# Patient Record
Sex: Female | Born: 1955
Health system: Southern US, Community
[De-identification: ages and names within clinical notes are randomized; demographics above are authoritative.]

## PROBLEM LIST (undated history)

## (undated) DIAGNOSIS — B019 Varicella without complication: Secondary | ICD-10-CM

## (undated) DIAGNOSIS — G43909 Migraine, unspecified, not intractable, without status migrainosus: Secondary | ICD-10-CM

## (undated) DIAGNOSIS — C801 Malignant (primary) neoplasm, unspecified: Secondary | ICD-10-CM

## (undated) DIAGNOSIS — K219 Gastro-esophageal reflux disease without esophagitis: Secondary | ICD-10-CM

## (undated) DIAGNOSIS — R002 Palpitations: Secondary | ICD-10-CM

## (undated) DIAGNOSIS — Z889 Allergy status to unspecified drugs, medicaments and biological substances status: Secondary | ICD-10-CM

## (undated) DIAGNOSIS — Z973 Presence of spectacles and contact lenses: Secondary | ICD-10-CM

## (undated) HISTORY — DX: Malignant (primary) neoplasm, unspecified: C80.1

## (undated) HISTORY — PX: WISDOM TOOTH EXTRACTION: SHX21

## (undated) HISTORY — PX: TUBAL LIGATION: SHX77

## (undated) HISTORY — DX: Varicella without complication: B01.9

## (undated) HISTORY — DX: Migraine, unspecified, not intractable, without status migrainosus: G43.909

## (undated) HISTORY — DX: Allergy status to unspecified drugs, medicaments and biological substances: Z88.9

## (undated) HISTORY — PX: BASAL CELL CARCINOMA EXCISION: SHX1214

## (undated) HISTORY — DX: Gastro-esophageal reflux disease without esophagitis: K21.9

---

## 1992-08-27 DIAGNOSIS — T8859XA Other complications of anesthesia, initial encounter: Secondary | ICD-10-CM

## 1992-08-27 HISTORY — DX: Other complications of anesthesia, initial encounter: T88.59XA

## 1992-08-27 HISTORY — PX: TUBAL LIGATION: SHX77

## 2009-11-25 LAB — HM COLONOSCOPY

## 2013-06-15 DIAGNOSIS — D171 Benign lipomatous neoplasm of skin and subcutaneous tissue of trunk: Secondary | ICD-10-CM | POA: Insufficient documentation

## 2013-06-15 DIAGNOSIS — N950 Postmenopausal bleeding: Secondary | ICD-10-CM | POA: Insufficient documentation

## 2015-10-20 MED FILL — AMOXICILLIN 875 MG TABLET: 875 | 10 days supply | Qty: 20 | Fill #0

## 2015-12-20 MED FILL — MECLIZINE 25 MG TABLET: 25 | 10 days supply | Qty: 30 | Fill #0

## 2016-02-01 ENCOUNTER — Ambulatory Visit (INDEPENDENT_AMBULATORY_CARE_PROVIDER_SITE_OTHER): Payer: Commercial Managed Care - PPO | Admitting: Obstetrics & Gynecology

## 2016-02-01 ENCOUNTER — Encounter: Payer: Self-pay | Admitting: Obstetrics & Gynecology

## 2016-02-01 VITALS — BP 110/72 | HR 53 | Resp 16 | Ht 66.0 in | Wt 145.0 lb

## 2016-02-01 DIAGNOSIS — E559 Vitamin D deficiency, unspecified: Secondary | ICD-10-CM | POA: Diagnosis not present

## 2016-02-01 DIAGNOSIS — Z124 Encounter for screening for malignant neoplasm of cervix: Secondary | ICD-10-CM

## 2016-02-01 DIAGNOSIS — Z01419 Encounter for gynecological examination (general) (routine) without abnormal findings: Secondary | ICD-10-CM

## 2016-02-01 DIAGNOSIS — Z1151 Encounter for screening for human papillomavirus (HPV): Secondary | ICD-10-CM

## 2016-02-01 MED ORDER — PROGESTERONE MICRONIZED 200 MG PO CAPS: 200.0000 mg | ORAL_CAPSULE | Freq: Every day | ORAL | Status: DC

## 2016-02-01 MED ORDER — ESTRADIOL 0.05 MG/24HR TD PTTW: 1.0000 | MEDICATED_PATCH | TRANSDERMAL | Status: DC

## 2016-02-01 NOTE — Progress Notes (Signed)
Subjective:    Krista Schwartz is a 60 y.o. MW P3 (all daughters) female who presents for an annual exam. The patient has no complaints today. She wants a refill of her HRT.  The patient is not currently sexually active. GYN screening history: last pap: was normal. The patient wears seatbelts: yes. The patient participates in regular exercise: yes. Has the patient ever been transfused or tattooed?: no. The patient reports that there is not domestic violence in her life.   Menstrual History: OB History    Gravida Para Term Preterm AB TAB SAB Ectopic Multiple Living   3 3 3       3       Menarche age: 53  No LMP recorded. Patient is postmenopausal. LMP about 5 years ago.    The following portions of the patient's history were reviewed and updated as appropriate: allergies, current medications, past family history, past medical history, past social history, past surgical history and problem list.  Review of Systems Pertinent items noted in HPI and remainder of comprehensive ROS otherwise negative. Married for 36 years, abstinent for several years due to husband's ED. 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 Mammogram UTD. Colonscopy normal at 60 yo. Her fasting labs are normal and UTD.   Objective:    BP 110/72 mmHg  Pulse 53  Resp 16  Ht 5\' 6"  (1.676 m)  Wt 145 lb (65.772 kg)  BMI 23.41 kg/m2  General Appearance:    Alert, cooperative, no distress, appears stated age  Head:    Normocephalic, without obvious abnormality, atraumatic  Eyes:    PERRL, conjunctiva/corneas clear, EOM's intact, fundi    benign, both eyes  Ears:    Normal TM's and external ear canals, both ears  Nose:   Nares normal, septum midline, mucosa normal, no drainage    or sinus tenderness  Throat:   Lips, mucosa, and tongue normal; teeth and gums normal  Neck:   Supple, symmetrical, trachea midline, no adenopathy;    thyroid:  no enlargement/tenderness/nodules; no carotid   bruit or JVD  Back:      Symmetric, no curvature, ROM normal, no CVA tenderness  Lungs:     Clear to auscultation bilaterally, respirations unlabored  Chest Wall:    No tenderness or deformity   Heart:    Regular rate and rhythm, S1 and S2 normal, no murmur, rub   or gallop  Breast Exam:    No tenderness, masses, or nipple abnormality  Abdomen:     Soft, non-tender, bowel sounds active all four quadrants,    no masses, no organomegaly  Genitalia:    Normal female without lesion, discharge or tenderness, NSSA, NT, mobile, no adnexal masses     Extremities:   Extremities normal, atraumatic, no cyanosis or edema  Pulses:   2+ and symmetric all extremities  Skin:   Skin color, texture, turgor normal, no rashes or lesions  Lymph nodes:   Cervical, supraclavicular, and axillary nodes normal  Neurologic:   CNII-XII intact, normal strength, sensation and reflexes    throughout  .    Assessment:    Healthy female exam.    Plan:     Thin prep Pap smear. with cotesting Recheck Vit D (h/o low level)

## 2016-02-02 LAB — CYTOLOGY - PAP

## 2016-02-02 LAB — VITAMIN D 25 HYDROXY (VIT D DEFICIENCY, FRACTURES): Vit D, 25-Hydroxy: 77 ng/mL (ref 30–100)

## 2016-02-03 NOTE — Addendum Note (Signed)
Addended by: Asencion Islam on: 02/03/2016 09:09 AM   Modules accepted: Medications

## 2016-06-13 ENCOUNTER — Telehealth: Payer: Self-pay | Admitting: Behavioral Health

## 2016-06-13 ENCOUNTER — Encounter: Payer: Self-pay | Admitting: Behavioral Health

## 2016-06-13 NOTE — Telephone Encounter (Signed)
Pre-Visit Call completed with patient and chart updated.   Pre-Visit Info documented in Specialty Comments under [employer].    

## 2016-06-14 ENCOUNTER — Encounter: Payer: Self-pay | Admitting: Family Medicine

## 2016-06-14 ENCOUNTER — Ambulatory Visit (INDEPENDENT_AMBULATORY_CARE_PROVIDER_SITE_OTHER): Payer: Commercial Managed Care - PPO | Admitting: Family Medicine

## 2016-06-14 VITALS — BP 119/72 | HR 55 | Temp 98.2°F | Ht 64.5 in | Wt 143.6 lb

## 2016-06-14 DIAGNOSIS — Z0000 Encounter for general adult medical examination without abnormal findings: Secondary | ICD-10-CM | POA: Diagnosis not present

## 2016-06-14 DIAGNOSIS — Z85828 Personal history of other malignant neoplasm of skin: Secondary | ICD-10-CM | POA: Diagnosis not present

## 2016-06-14 DIAGNOSIS — N63 Unspecified lump in unspecified breast: Secondary | ICD-10-CM | POA: Diagnosis not present

## 2016-06-14 NOTE — Patient Instructions (Signed)
It was very nice to meet you today- I can certainly see you for a physical at your convenience.  We will arrange a diagnostic mammogram for you- this is a mammogram with ultrasound as necessary. Let me know if you do not hear about this appt in the next week or so.  Take care!

## 2016-06-14 NOTE — Progress Notes (Signed)
[facility] [address] [place], [place] [address] [phone]  Date:  06/14/2016   Name:  Krista Schwartz   DOB:  [birth year]   MRN:  [id number]  PCP:  Emily Filbert, MD    Chief Complaint: Establish Care (Pt here to est care. )   History of Present Illness:  Krista Schwartz is a 60 y.o. very pleasant female patient who presents with the following:  Here today as a new patient to establish care.  She does have an OBG- was with Dr. Carylon Perches with [facility].   -she changed to Dr. Hulan Fray as Dr. Tessa Lerner left the practice.  She is located in [place]  Flu shot is UTD She has 3 children ages; 58, 96 and 37.  She has 5 grand and 1 more on the way.  All 3 of her children are nearby.    She sees June Leap for dermatology.  History of non- melanoma sking cancer  She is on HRT per Dr. Hulan Fray.    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   She had bad allergies last year- she hopes that this will not happen again Never a smoker.  She does enjoy walking.  She does not have a lot of free time- she enjoys reading and crafting.   She has noted a possible bump on her right breast for the last few days. She has had a lipoma in the past and wonders if this might be the same thing  Last labs done last year- everything looked fine per her report.   Colonoscopy is UTD There are no active problems to display for this patient.   Past Medical History:  Diagnosis Date  . Cancer (HCC)    basal and squamous cell  . H/O seasonal allergies     Past Surgical History:  Procedure Laterality Date  . TUBAL LIGATION    . WISDOM TOOTH EXTRACTION      Social History  Substance Use Topics  . Smoking status: Never Smoker  . Smokeless tobacco: Never Used  . Alcohol use No    Family History  Problem Relation Age of Onset  . Parkinson's disease Mother   . Heart  attack Father   . Cancer Paternal Grandfather     skin  . Cancer Maternal Grandmother     skin    No Known Allergies  Medication list has been reviewed and updated.  Current Outpatient Prescriptions on File Prior to Visit  Medication Sig Dispense Refill  . Magnesium 400 MG CAPS Take by mouth daily.    . meclizine (ANTIVERT) 25 MG tablet   1  . Menaquinone-7 (VITAMIN K2 PO) Take 500 mcg by mouth every other day.     . progesterone (PROMETRIUM) 200 MG capsule Take 1 capsule (200 mg total) by mouth daily. 60 capsule 7   No current facility-administered medications on file prior to visit.     Review of Systems:  As per HPI- otherwise negative.  No fever, chills, nausea, vomiting, CP, SOB   Physical Examination: Blood pressure 119/72, pulse (!) 55, temperature 98.2 F (36.8 C), temperature source Oral, height 5' 4.5" (1.638 m), weight 143 lb 9.6 oz (65.1 kg), SpO2 100 %.  Vitals:   06/14/16 1048  Weight: 143 lb 9.6 oz (65.1 kg)  Height: 5' 4.5" (1.638 m)   Body mass index  is 24.27 kg/m. Ideal Body Weight: Weight in (lb) to have BMI = 25: 147.6  GEN: WDWN, NAD, Non-toxic, A & O x 3, normal weight, looks well HEENT: Atraumatic, Normocephalic. Neck supple. No masses, No LAD.  Bilateral TM wnl, oropharynx normal.  PEERL,EOMI.   Ears and Nose: No external deformity. CV: RRR, No M/G/R. No JVD. No thrill. No extra heart sounds. PULM: CTA B, no wheezes, crackles, rhonchi. No retractions. No resp. distress. No accessory muscle use. EXTR: No c/c/e NEURO Normal gait.  PSYCH: Normally interactive. Conversant. Not depressed or anxious appearing.  Calm demeanor.  She indicates a firm area in the right upper outer breast- this does not feel worrisome but will certainly look at it further   Assessment and Plan: Breast mass - Plan: MM DIAG BREAST TOMO UNI RIGHT, MM SCREENING BREAST TOMO BILATERAL  Encounter for medical examination to establish care  History of skin cancer  Here to  establish care as a new patient.  She is quite healthy except for history of skin cancer She has a breast concern and is due for screening so will set up a diagnostic mammo for her Continue derm follow-up as usual   Signed Lamar Blinks, MD

## 2016-06-14 NOTE — Progress Notes (Signed)
Pre visit review using our clinic review tool, if applicable. No additional management support is needed unless otherwise documented below in the visit note. 

## 2016-06-15 ENCOUNTER — Encounter: Payer: Self-pay | Admitting: Emergency Medicine

## 2016-06-21 ENCOUNTER — Telehealth: Payer: Self-pay | Admitting: Family Medicine

## 2016-06-21 ENCOUNTER — Other Ambulatory Visit: Payer: Self-pay | Admitting: Family Medicine

## 2016-06-21 DIAGNOSIS — N63 Unspecified lump in unspecified breast: Secondary | ICD-10-CM

## 2016-06-21 NOTE — Telephone Encounter (Signed)
Caller name: Amber with [facility] Can be reached: [phone]  Reason for call: please change mammogram order. Needs to be for diagnostic bilateral mammo [phone]. Please add [place] right breast limited [phone].

## 2016-06-22 NOTE — Telephone Encounter (Signed)
[employer], orders have been updated by Dr. Lorelei Pont. No further needs at this time.

## 2016-07-02 ENCOUNTER — Ambulatory Visit
Admission: RE | Admit: 2016-07-02 | Discharge: 2016-07-02 | Disposition: A | Discharge status: Home / Self Care | Payer: Commercial Managed Care - PPO | Source: Ambulatory Visit | Attending: Family Medicine | Admitting: Family Medicine

## 2016-07-02 ENCOUNTER — Ambulatory Visit (INDEPENDENT_AMBULATORY_CARE_PROVIDER_SITE_OTHER): Payer: Commercial Managed Care - PPO | Admitting: Family Medicine

## 2016-07-02 ENCOUNTER — Encounter: Payer: Self-pay | Admitting: Family Medicine

## 2016-07-02 VITALS — BP 110/62 | HR 58 | Temp 98.5°F | Resp 16 | Ht 64.5 in | Wt 145.2 lb

## 2016-07-02 DIAGNOSIS — N63 Unspecified lump in unspecified breast: Secondary | ICD-10-CM

## 2016-07-02 DIAGNOSIS — R531 Weakness: Secondary | ICD-10-CM

## 2016-07-02 DIAGNOSIS — J0140 Acute pansinusitis, unspecified: Secondary | ICD-10-CM

## 2016-07-02 LAB — POC INFLUENZA A&B (BINAX/QUICKVUE)
Influenza A, POC: NEGATIVE
Influenza B, POC: NEGATIVE

## 2016-07-02 MED ORDER — FLUTICASONE PROPIONATE 50 MCG/ACT NA SUSP: 2.0000 | Freq: Every day | NASAL | 6 refills | Status: DC

## 2016-07-02 MED ORDER — FLUCONAZOLE 150 MG PO TABS: 150.0000 mg | ORAL_TABLET | Freq: Once | ORAL | 0 refills | Status: AC

## 2016-07-02 MED ORDER — FLUCONAZOLE 150 MG PO TABS: 150.0000 mg | ORAL_TABLET | Freq: Once | ORAL | 0 refills | Status: DC

## 2016-07-02 MED ORDER — AMOXICILLIN-POT CLAVULANATE 875-125 MG PO TABS: 1.0000 | ORAL_TABLET | Freq: Two times a day (BID) | ORAL | 0 refills | Status: DC

## 2016-07-02 MED FILL — FLUCONAZOLE 150 MG TABLET: 150 | 2 days supply | Qty: 2 | Fill #0

## 2016-07-02 MED FILL — SM ALLERGY RELIEF 50 MCG SP: 50 MCG | 30 days supply | Qty: 16 | Fill #0

## 2016-07-02 MED FILL — AMOX-CLAV 875-125 MG TABLET: 875-125 | 10 days supply | Qty: 20 | Fill #0

## 2016-07-02 NOTE — Patient Instructions (Signed)

## 2016-07-02 NOTE — Progress Notes (Signed)
Pre visit review using our clinic review tool, if applicable. No additional management support is needed unless otherwise documented below in the visit note. 

## 2016-07-02 NOTE — Progress Notes (Signed)
Patient ID: Krista Schwartz, female    DOB: [birth year]  Age: 60 y.o. MRN: [id number]    Subjective:  Subjective  HPI Krista Schwartz presents for sinus congestion and cough.  It started with sore throat and that is now gone.   She is taking otc cough med, allergra/ zyrtec with little relief.  No fever.     Review of Systems  Constitutional: Negative for chills and fever.  HENT: Positive for congestion, postnasal drip, rhinorrhea and sinus pressure.   Respiratory: Positive for cough and wheezing. Negative for chest tightness and shortness of breath.   Cardiovascular: Negative for chest pain, palpitations and leg swelling.  Allergic/Immunologic: Negative for environmental allergies.    History Past Medical History:  Diagnosis Date  . Cancer (HCC)    basal and squamous cell  . Chicken pox   . GERD (gastroesophageal reflux disease)   . H/O seasonal allergies   . Migraines     She has a past surgical history that includes Tubal ligation and Wisdom tooth extraction.   Her family history includes Cancer in her maternal grandmother and paternal grandfather; Heart attack in her father; Hyperlipidemia in her other; Hypertension in her other; Parkinson's disease in her mother.She reports that she has never smoked. She has never used smokeless tobacco. She reports that she does not drink alcohol or use drugs.  Current Outpatient Prescriptions on File Prior to Visit  Medication Sig Dispense Refill  . Cholecalciferol (VITAMIN D3) 5000 units TABS Take 1 tablet by mouth every other day.    . estradiol (VIVELLE-DOT) 0.05 MG/24HR patch Place 1 patch onto the skin 2 (two) times a week.    . fexofenadine (ALLEGRA) 180 MG tablet Take 180 mg by mouth daily as needed.     . Magnesium 400 MG CAPS Take by mouth daily.    . meclizine (ANTIVERT) 25 MG tablet   1  . Menaquinone-7 (VITAMIN K2 PO) Take 500 mcg by mouth every other day.     . progesterone (PROMETRIUM) 200 MG capsule Take 1 capsule  (200 mg total) by mouth daily. 60 capsule 7   No current facility-administered medications on file prior to visit.      Objective:  Objective  Physical Exam  Constitutional: She is oriented to person, place, and time. She appears well-developed and well-nourished.  HENT:  Right Ear: External ear normal.  Left Ear: External ear normal.  Nose: Right sinus exhibits maxillary sinus tenderness and frontal sinus tenderness. Left sinus exhibits maxillary sinus tenderness and frontal sinus tenderness.  + PND + errythema  Eyes: Conjunctivae are normal. Right eye exhibits no discharge. Left eye exhibits no discharge.  Cardiovascular: Normal rate, regular rhythm and normal heart sounds.   No murmur heard. Pulmonary/Chest: Effort normal and breath sounds normal. No respiratory distress. She has no wheezes. She has no rales. She exhibits no tenderness.  Musculoskeletal: She exhibits no edema.  Lymphadenopathy:    She has cervical adenopathy.       Right cervical: Superficial cervical adenopathy present.       Left cervical: Superficial cervical adenopathy present.  Neurological: She is alert and oriented to person, place, and time.  Nursing note and vitals reviewed.  BP 110/62 (BP Location: Left Arm, Patient Position: Sitting, Cuff Size: Normal)   Pulse (!) 58   Temp 98.5 F (36.9 C) (Oral)   Resp 16   Ht 5' 4.5" (1.638 m)   Wt 145 lb 3.2 oz (65.9 kg)   SpO2 98%  BMI 24.54 kg/m  Wt Readings from Last 3 Encounters:  07/02/16 145 lb 3.2 oz (65.9 kg)  06/14/16 143 lb 9.6 oz (65.1 kg)  02/01/16 145 lb (65.8 kg)     No results found for: WBC, HGB, HCT, PLT, GLUCOSE, CHOL, TRIG, HDL, LDLDIRECT, LDLCALC, ALT, AST, NA, K, CL, CREATININE, BUN, CO2, TSH, PSA, INR, GLUF, HGBA1C, MICROALBUR  Patient was never admitted.   Assessment & Plan:  Plan  I am having Krista Schwartz start on amoxicillin-clavulanate. I am also having her maintain her meclizine, progesterone, Magnesium, Menaquinone-7  (VITAMIN K2 PO), Vitamin D3, estradiol, fexofenadine, fluconazole, and fluticasone.  Meds ordered this encounter  Medications  . amoxicillin-clavulanate (AUGMENTIN) 875-125 MG tablet    Sig: Take 1 tablet by mouth 2 (two) times daily.    Dispense:  20 tablet    Refill:  0  . DISCONTD: fluticasone (FLONASE) 50 MCG/ACT nasal spray    Sig: Place 2 sprays into both nostrils daily.    Dispense:  16 g    Refill:  6  . DISCONTD: fluconazole (DIFLUCAN) 150 MG tablet    Sig: Take 1 tablet (150 mg total) by mouth once. May repeat in 3 days prn    Dispense:  2 tablet    Refill:  0  . fluconazole (DIFLUCAN) 150 MG tablet    Sig: Take 1 tablet (150 mg total) by mouth once. May repeat in 3 days prn    Dispense:  2 tablet    Refill:  0  . fluticasone (FLONASE) 50 MCG/ACT nasal spray    Sig: Place 2 sprays into both nostrils daily.    Dispense:  16 g    Refill:  6    Problem List Items Addressed This Visit    None    Visit Diagnoses    Weakness generalized    -  Primary   Relevant Orders   POC Influenza A&B (Binax test) (Completed)   Acute pansinusitis, recurrence not specified       Relevant Medications   amoxicillin-clavulanate (AUGMENTIN) 875-125 MG tablet   fluconazole (DIFLUCAN) 150 MG tablet   fluticasone (FLONASE) 50 MCG/ACT nasal spray      Follow-up: Return if symptoms worsen or fail to improve.  Ann Held, DO

## 2016-10-04 ENCOUNTER — Other Ambulatory Visit: Payer: Self-pay | Admitting: Obstetrics & Gynecology

## 2017-01-14 DIAGNOSIS — Z85828 Personal history of other malignant neoplasm of skin: Secondary | ICD-10-CM | POA: Diagnosis not present

## 2017-01-14 DIAGNOSIS — L570 Actinic keratosis: Secondary | ICD-10-CM | POA: Diagnosis not present

## 2017-01-14 DIAGNOSIS — Z08 Encounter for follow-up examination after completed treatment for malignant neoplasm: Secondary | ICD-10-CM | POA: Diagnosis not present

## 2017-01-23 ENCOUNTER — Ambulatory Visit (INDEPENDENT_AMBULATORY_CARE_PROVIDER_SITE_OTHER): Payer: Commercial Managed Care - PPO | Admitting: Family Medicine

## 2017-01-23 VITALS — BP 126/74 | HR 68 | Temp 98.3°F | Ht 65.0 in | Wt 146.6 lb

## 2017-01-23 DIAGNOSIS — J209 Acute bronchitis, unspecified: Secondary | ICD-10-CM

## 2017-01-23 DIAGNOSIS — J029 Acute pharyngitis, unspecified: Secondary | ICD-10-CM

## 2017-01-23 LAB — POCT RAPID STREP A (OFFICE): Rapid Strep A Screen: NEGATIVE

## 2017-01-23 MED ORDER — ALBUTEROL SULFATE HFA 108 (90 BASE) MCG/ACT IN AERS: 2.0000 | INHALATION_SPRAY | Freq: Four times a day (QID) | RESPIRATORY_TRACT | 0 refills | Status: DC | PRN

## 2017-01-23 MED ORDER — AZITHROMYCIN 250 MG PO TABS: ORAL_TABLET | ORAL | 0 refills | Status: DC

## 2017-01-23 MED ORDER — PREDNISONE 20 MG PO TABS: ORAL_TABLET | ORAL | 0 refills | Status: DC

## 2017-01-23 MED FILL — AZITHROMYCIN 250 MG TABLET: 250 | 5 days supply | Qty: 6 | Fill #0

## 2017-01-23 MED FILL — predniSONE 20 MG TABS: 20 | 6 days supply | Qty: 9 | Fill #0

## 2017-01-23 NOTE — Progress Notes (Signed)
[facility] at [facility] [address], [place], [place] [address] [phone]  Date:  01/23/2017   Name:  Krista Schwartz   DOB:  [birth year]   MRN:  [id number]  PCP:  Darreld Mclean, MD    Chief Complaint: Sore Throat (c/o sore throat, loss of voice, heaviness in chest, dry cough x 2 weeks. )   History of Present Illness:  Krista Schwartz is a 61 y.o. very pleasant female patient who presents with the following:  Generally healthy woman except for history of skin cancer- here today with illness for about 2 weeks  She first noted a cough, then she began to lose her voice.  Her throat is sore off and on, more so this weekend- today is wednesday No fever noted, no aches or chills No GI symptoms Some sneezing, she is taking her allergy meds She did notice some wheezing today- she does not generally have wheezing and does not have an inhaler The cough is dry She is post- menopausal No sick contacts at home Her GYN is Dr. Hulan Schwartz- she will see her for follow-up soon  She has tried some aleve, some OTC allergy meds.  No antipyretics today   Patient Active Problem List   Diagnosis Date Noted  . History of skin cancer 06/14/2016    Past Medical History:  Diagnosis Date  . Cancer (HCC)    basal and squamous cell  . Chicken pox   . GERD (gastroesophageal reflux disease)   . H/O seasonal allergies   . Migraines     Past Surgical History:  Procedure Laterality Date  . TUBAL LIGATION    . WISDOM TOOTH EXTRACTION      Social History  Substance Use Topics  . Smoking status: Never Smoker  . Smokeless tobacco: Never Used  . Alcohol use No    Family History  Problem Relation Age of Onset  . Parkinson's disease Mother   . Heart attack Father   . Cancer Paternal Grandfather        skin  . Cancer Maternal Grandmother        skin  . Hypertension Other   . Hyperlipidemia Other     No Known  Allergies  Medication list has been reviewed and updated.  Current Outpatient Prescriptions on File Prior to Visit  Medication Sig Dispense Refill  . Cholecalciferol (VITAMIN D3) 5000 units TABS Take 1 tablet by mouth every other day.    . estradiol (VIVELLE-DOT) 0.05 MG/24HR patch PLACE 1 PATCH ONTO THE SKIN TWICE A WEEK 8 patch 6  . fexofenadine (ALLEGRA) 180 MG tablet Take 180 mg by mouth daily as needed.     . meclizine (ANTIVERT) 25 MG tablet   1  . Menaquinone-7 (VITAMIN K2 PO) Take 500 mcg by mouth every other day.     . progesterone (PROMETRIUM) 200 MG capsule Take 1 capsule (200 mg total) by mouth daily. 60 capsule 7   No current facility-administered medications on file prior to visit.     Review of Systems:  As per HPI- otherwise negative. Eating normally Planning to travel to [place] for business later on this week   Physical Examination: Vitals:   01/23/17 0832  BP: 126/74  Pulse: 68  Temp: 98.3 F (36.8 C)   Vitals:   01/23/17 0832  Weight: 146 lb 9.6 oz (66.5 kg)  Height: 5\' 5"  (1.651 m)   Body mass index is 24.4  kg/m. Ideal Body Weight: Weight in (lb) to have BMI = 25: 149.9  GEN: WDWN, NAD, Non-toxic, A & O x 3, appears healthy and younger than age, but does not feel well today Hoarse voice  HEENT: Atraumatic, Normocephalic. Neck supple. No masses, No LAD.  Bilateral TM wnl, oropharynx normal.  PEERL,EOMI.   Ears and Nose: No external deformity. CV: RRR, No M/G/R. No JVD. No thrill. No extra heart sounds. PULM: CTA B, no wheezes, crackles, rhonchi. No retractions. No resp. distress. No accessory muscle use. EXTR: No c/c/e NEURO Normal gait.  PSYCH: Normally interactive. Conversant. Not depressed or anxious appearing.  Calm demeanor.   Results for orders placed or performed in visit on 01/23/17  POCT rapid strep A  Result Value Ref Range   Rapid Strep A Screen Negative Negative    Assessment and Plan: Sore throat - Plan: POCT rapid strep  A  Acute bronchitis, unspecified organism - Plan: predniSONE (DELTASONE) 20 MG tablet, albuterol (PROVENTIL HFA;VENTOLIN HFA) 108 (90 Base) MCG/ACT inhaler, azithromycin (ZITHROMAX) 250 MG tablet  Here today with acute illness Her main sx are congestion, wheezing and hoarse voice, cough Will treat with prednisone, albuterol and azithromycin No NSAIDs while on prednisone She will let me know if not feeling better soon  Signed Lamar Blinks, MD

## 2017-01-23 NOTE — Patient Instructions (Signed)
I am sorry that you are not feeling well!   Use the albuterol inhaler as needed for cough, wheezing and chest tightness Also ok to use OTC cough medications as needed Use the prednisone for 6 days, and the azithromycin (antibioic) for 5 days as directed Avoid NSAIDs like ibuprofen or aleve while on the prednisone   Please let me know if you are not feeling better in the next few days- Sooner if worse.

## 2017-02-06 ENCOUNTER — Ambulatory Visit (INDEPENDENT_AMBULATORY_CARE_PROVIDER_SITE_OTHER): Payer: Commercial Managed Care - PPO

## 2017-02-06 ENCOUNTER — Encounter: Payer: Self-pay | Admitting: Obstetrics & Gynecology

## 2017-02-06 ENCOUNTER — Ambulatory Visit (INDEPENDENT_AMBULATORY_CARE_PROVIDER_SITE_OTHER): Payer: Commercial Managed Care - PPO | Admitting: Obstetrics & Gynecology

## 2017-02-06 VITALS — BP 93/59 | HR 57 | Resp 16 | Ht 66.0 in | Wt 144.0 lb

## 2017-02-06 DIAGNOSIS — Z1151 Encounter for screening for human papillomavirus (HPV): Secondary | ICD-10-CM

## 2017-02-06 DIAGNOSIS — N950 Postmenopausal bleeding: Secondary | ICD-10-CM

## 2017-02-06 DIAGNOSIS — Z01419 Encounter for gynecological examination (general) (routine) without abnormal findings: Secondary | ICD-10-CM | POA: Diagnosis not present

## 2017-02-06 DIAGNOSIS — Z124 Encounter for screening for malignant neoplasm of cervix: Secondary | ICD-10-CM | POA: Diagnosis not present

## 2017-02-06 LAB — CBC
HCT: 41.8 % (ref 35.0–45.0)
Hemoglobin: 13.7 g/dL (ref 11.7–15.5)
MCH: 28.8 pg (ref 27.0–33.0)
MCHC: 32.8 g/dL (ref 32.0–36.0)
MCV: 88 fL (ref 80.0–100.0)
MPV: 10.7 fL (ref 7.5–12.5)
Platelets: 267 10*3/uL (ref 140–400)
RBC: 4.75 MIL/uL (ref 3.80–5.10)
RDW: 13.1 % (ref 11.0–15.0)
WBC: 6.3 10*3/uL (ref 3.8–10.8)

## 2017-02-06 LAB — LIPID PANEL
Cholesterol: 197 mg/dL (ref <200)
HDL: 45 mg/dL — ABNORMAL LOW (ref >50)
LDL Cholesterol: 130 mg/dL — ABNORMAL HIGH (ref <100)
Total CHOL/HDL Ratio: 4.4 Ratio (ref <5.0)
Triglycerides: 112 mg/dL (ref <150)
VLDL: 22 mg/dL (ref <30)

## 2017-02-06 LAB — COMPREHENSIVE METABOLIC PANEL
ALT: 9 U/L (ref 6–29)
AST: 14 U/L (ref 10–35)
Albumin: 4.1 g/dL (ref 3.6–5.1)
Alkaline Phosphatase: 70 U/L (ref 33–130)
BUN: 11 mg/dL (ref 7–25)
CO2: 26 mmol/L (ref 20–31)
Calcium: 9 mg/dL (ref 8.6–10.4)
Chloride: 107 mmol/L (ref 98–110)
Creat: 1.01 mg/dL (ref 0.50–1.05)
Glucose, Bld: 85 mg/dL (ref 65–99)
Potassium: 4 mmol/L (ref 3.5–5.3)
Sodium: 140 mmol/L (ref 135–146)
Total Bilirubin: 0.7 mg/dL (ref 0.2–1.2)
Total Protein: 6.3 g/dL (ref 6.1–8.1)

## 2017-02-06 NOTE — Addendum Note (Signed)
Addended by: Asencion Islam on: 02/06/2017 08:54 AM   Modules accepted: Orders

## 2017-02-06 NOTE — Progress Notes (Signed)
Subjective:    Krista Schwartz is a 61 y.o. MW P3 (all daughters- Anderson Malta is our nurse at the [employer] office) female who presents for an annual exam. The patient has no complaints today except she has had some spotting this year, the beginning of April. On prometrium and estrogen patch.  The patient is not currently sexually active. GYN screening history: last pap: was normal. The patient wears seatbelts: yes. The patient participates in regular exercise: yes. Has the patient ever been transfused or tattooed?: no. The patient reports that there is not domestic violence in her life.   Menstrual History: OB History    Gravida Para Term Preterm AB Living   3 3 3     3    SAB TAB Ectopic Multiple Live Births                  Menarche age: 44 No LMP recorded. Patient is postmenopausal.    The following portions of the patient's history were reviewed and updated as appropriate: allergies, current medications, past family history, past medical history, past social history, past surgical history and problem list.  Review of Systems Pertinent items are noted in HPI.   Married for 63 years Works at [employer] [place]- no breast/gyn/colon cancer Colonoscopy due at 61 yo   Objective:    BP (!) 93/59   Pulse (!) 57   Resp 16   Ht 5\' 6"  (1.676 m)   Wt 144 lb (65.3 kg)   BMI 23.24 kg/m   General Appearance:    Alert, cooperative, no distress, appears stated age  Head:    Normocephalic, without obvious abnormality, atraumatic  Eyes:    PERRL, conjunctiva/corneas clear, EOM's intact, fundi    benign, both eyes  Ears:    Normal TM's and external ear canals, both ears  Nose:   Nares normal, septum midline, mucosa normal, no drainage    or sinus tenderness  Throat:   Lips, mucosa, and tongue normal; teeth and gums normal  Neck:   Supple, symmetrical, trachea midline, no adenopathy;    thyroid:  no enlargement/tenderness/nodules; no carotid   bruit or JVD  Back:      Symmetric, no curvature, ROM normal, no CVA tenderness  Lungs:     Clear to auscultation bilaterally, respirations unlabored  Chest Wall:    No tenderness or deformity   Heart:    Regular rate and rhythm, S1 and S2 normal, no murmur, rub   or gallop  Breast Exam:    No tenderness, masses, or nipple abnormality  Abdomen:     Soft, non-tender, bowel sounds active all four quadrants,    no masses, no organomegaly  Genitalia:    Normal female without lesion, discharge or tenderness, NSSA, NT, mobile, normal adnexal exam     Extremities:   Extremities normal, atraumatic, no cyanosis or edema  Pulses:   2+ and symmetric all extremities  Skin:   Skin color, texture, turgor normal, no rashes or lesions  Lymph nodes:   Cervical, supraclavicular, and axillary nodes normal  Neurologic:   CNII-XII intact, normal strength, sensation and reflexes    throughout  .    Assessment:    Healthy female exam.   PMB on combination HRT   Plan:     Thin prep Pap smear. with cotesting Gyn u/s

## 2017-02-07 ENCOUNTER — Telehealth: Payer: Self-pay | Admitting: *Deleted

## 2017-02-07 DIAGNOSIS — R9389 Abnormal findings on diagnostic imaging of other specified body structures: Secondary | ICD-10-CM

## 2017-02-07 LAB — VITAMIN D 25 HYDROXY (VIT D DEFICIENCY, FRACTURES): Vit D, 25-Hydroxy: 57 ng/mL (ref 30–100)

## 2017-02-07 MED ORDER — MISOPROSTOL 200 MCG PO TABS: ORAL_TABLET | ORAL | 0 refills | Status: DC

## 2017-02-07 NOTE — Telephone Encounter (Signed)
Pt notied of U/S results and pt is scheduled for Endometrial Biopsy and per Dr Rea College for Cytotec 600 mg sent to [facility]

## 2017-02-12 LAB — CYTOLOGY - PAP
Diagnosis: NEGATIVE
HPV: NOT DETECTED

## 2017-02-19 ENCOUNTER — Encounter: Payer: Self-pay | Admitting: Obstetrics & Gynecology

## 2017-02-19 ENCOUNTER — Ambulatory Visit (INDEPENDENT_AMBULATORY_CARE_PROVIDER_SITE_OTHER): Payer: Commercial Managed Care - PPO | Admitting: Obstetrics & Gynecology

## 2017-02-19 VITALS — BP 128/78 | HR 51 | Resp 16 | Ht 66.0 in | Wt 144.0 lb

## 2017-02-19 DIAGNOSIS — N950 Postmenopausal bleeding: Secondary | ICD-10-CM | POA: Diagnosis not present

## 2017-02-19 NOTE — Progress Notes (Signed)
   Subjective:    Patient ID: Krista Schwartz, female    DOB: [birth year], 61 y.o.   MRN: [id number]  HPI 61 yo MW P3 here for [facility] due to PMB on HRT. Endometrial thickness on u/s was 6 mm. She took cytotec last night.   Review of Systems     Objective:   Physical Exam [id number] Breathing, conversing, and ambulating normally  UPT negative, consent signed, time out done Cervix prepped with betadine and grasped with a single tooth tenaculum Uterus sounded to 8 cm Pipelle used for 2 passes with a small amount of tissue obtained. She tolerated the procedure well.      Assessment & Plan:  PMB- probably due to HRT Await pathology results

## 2017-02-19 NOTE — Addendum Note (Signed)
Addended by: Asencion Islam on: 02/19/2017 04:05 PM   Modules accepted: Orders

## 2017-02-20 ENCOUNTER — Other Ambulatory Visit: Payer: Self-pay | Admitting: Obstetrics & Gynecology

## 2017-02-26 ENCOUNTER — Other Ambulatory Visit: Payer: Self-pay | Admitting: *Deleted

## 2017-02-26 DIAGNOSIS — Z780 Asymptomatic menopausal state: Secondary | ICD-10-CM

## 2017-02-26 MED ORDER — PROGESTERONE MICRONIZED 200 MG PO CAPS: 200.0000 mg | ORAL_CAPSULE | Freq: Every day | ORAL | 7 refills | Status: DC

## 2017-02-26 NOTE — Telephone Encounter (Signed)
RF request for Progesterone 200 mg sent to [facility] [address] [facility] per Dr Hulan Fray

## 2017-04-18 ENCOUNTER — Other Ambulatory Visit: Payer: Self-pay | Admitting: Obstetrics & Gynecology

## 2017-04-22 ENCOUNTER — Other Ambulatory Visit: Payer: Self-pay | Admitting: *Deleted

## 2017-04-22 MED ORDER — ESTRADIOL 0.05 MG/24HR TD PTTW: MEDICATED_PATCH | TRANSDERMAL | 10 refills | Status: DC

## 2017-06-19 ENCOUNTER — Other Ambulatory Visit: Payer: Self-pay | Admitting: Emergency Medicine

## 2017-06-19 MED ORDER — MECLIZINE HCL 25 MG PO TABS
25.0000 mg | ORAL_TABLET | ORAL | 1 refills | Status: DC | PRN
Start: 2017-06-19 — End: 2020-09-08

## 2017-07-10 DIAGNOSIS — L570 Actinic keratosis: Secondary | ICD-10-CM | POA: Diagnosis not present

## 2017-07-17 ENCOUNTER — Other Ambulatory Visit (HOSPITAL_COMMUNITY): Payer: Self-pay | Admitting: Obstetrics & Gynecology

## 2017-07-17 DIAGNOSIS — Z1231 Encounter for screening mammogram for malignant neoplasm of breast: Secondary | ICD-10-CM

## 2017-08-14 ENCOUNTER — Ambulatory Visit
Admission: RE | Admit: 2017-08-14 | Discharge: 2017-08-14 | Disposition: A | Discharge status: Home / Self Care | Payer: Commercial Managed Care - PPO | Source: Ambulatory Visit | Attending: Obstetrics & Gynecology | Admitting: Obstetrics & Gynecology

## 2017-08-14 DIAGNOSIS — Z1231 Encounter for screening mammogram for malignant neoplasm of breast: Secondary | ICD-10-CM | POA: Diagnosis not present

## 2017-09-21 NOTE — Progress Notes (Addendum)
[facility] [address], [place], [place] [address] [phone]  Date:  09/23/2017   Name:  Krista Schwartz   DOB:  Oct 14, 1955   MRN:  [id number]  PCP:  Darreld Mclean, MD    Chief Complaint: Annual Exam (Pt here for CPE and labs. )   History of Present Illness:  Krista Schwartz is a 62 y.o. very pleasant female patient who presents with the following:  Here today for a CPE History of skin cancer, GERD, allergies  Last visit here in May with illness She sees Dr. Hulan Fray for GYN care and had endo bx for post- menopausal bleeding last year.   bx results were negative  Labs: she is fasting  Flu: done  Tetanus: she had the tdap in 2009, now due for an update  Colon: 2011 Mammo: 2018 Hep C:   She continues to see dermatology on a regular basis  She is getting "minimal" exercise due to work She feels like her sleep is good Mood is fall No falls   Her brother recently had an MI and was dx with bicuspid aortic valve.  She was told that this is genetic and she should be tested as well   Pt reports she had a normal EKG in 2017 at [facility] but is ok with my repeating this today No SOB She may occasionally notice a minute or so of chest discomfort which she attributes to GERD- this is not anything new to her She never has any CP with exertion  Patient Active Problem List   Diagnosis Date Noted  . History of skin cancer 06/14/2016    Past Medical History:  Diagnosis Date  . Cancer (HCC)    basal and squamous cell  . Chicken pox   . GERD (gastroesophageal reflux disease)   . H/O seasonal allergies   . Migraines     Past Surgical History:  Procedure Laterality Date  . TUBAL LIGATION    . WISDOM TOOTH EXTRACTION      Social History   Tobacco Use  . Smoking status: Never Smoker  . Smokeless tobacco: Never Used  Substance Use Topics  . Alcohol use: No    Alcohol/week: 0.0 oz  . Drug  use: No    Family History  Problem Relation Age of Onset  . Parkinson's disease Mother   . Heart attack Father   . Cancer Paternal Grandfather        skin  . Cancer Maternal Grandmother        skin  . Hypertension Other   . Hyperlipidemia Other     No Known Allergies  Medication list has been reviewed and updated.  Current Outpatient Medications on File Prior to Visit  Medication Sig Dispense Refill  . Cholecalciferol (VITAMIN D3) 5000 units TABS Take 1 tablet by mouth every other day.    . estradiol (VIVELLE-DOT) 0.05 MG/24HR patch Place 1 patch on skin twice weekly 8 patch 10  . fexofenadine (ALLEGRA) 180 MG tablet Take 180 mg by mouth daily as needed.     . meclizine (ANTIVERT) 25 MG tablet Take 1 tablet (25 mg total) by mouth as needed for dizziness. 30 tablet 1  . progesterone (PROMETRIUM) 200 MG capsule Take 1 capsule (200 mg total) by mouth daily. 60 capsule 7   No current facility-administered medications on file prior to visit.     Review of Systems:  As per HPI-  otherwise negative. No fever or chills   Physical Examination: Vitals:   09/23/17 1049  BP: 112/82  Pulse: (!) 59  Temp: 98 F (36.7 C)  SpO2: 98%   Vitals:   09/23/17 1049  Weight: 151 lb 6.4 oz (68.7 kg)  Height: 5' 4.5" (1.638 m)   Body mass index is 25.59 kg/m. Ideal Body Weight: Weight in (lb) to have BMI = 25: 147.6  GEN: WDWN, NAD, Non-toxic, A & O x 3, looks well HEENT: Atraumatic, Normocephalic. Neck supple. No masses, No LAD.  Bilateral TM wnl, oropharynx normal.  PEERL,EOMI.   Ears and Nose: No external deformity. CV: RRR, No M/G/R. No JVD. No thrill. No extra heart sounds. PULM: CTA B, no wheezes, crackles, rhonchi. No retractions. No resp. distress. No accessory muscle use. ABD: S, NT, ND, +BS. No rebound. No HSM. EXTR: No c/c/e NEURO Normal gait.  PSYCH: Normally interactive. Conversant. Not depressed or anxious appearing.  Calm demeanor.   EKG: sinus brady, nothing of  concern Assessment and Plan: Physical exam  History of skin cancer  Encounter for hepatitis C screening test for low risk patient - Plan: Hepatitis C antibody  Screening for hyperlipidemia - Plan: Lipid panel  Screening for deficiency anemia - Plan: CBC  Screening for diabetes mellitus - Plan: Comprehensive metabolic panel, Hemoglobin A1c  Immunization due - Plan: Td vaccine greater than or equal to 7yo preservative free IM  Family history of first degree relative with bicuspid aortic valve - Plan: EKG 12-Lead, ECHOCARDIOGRAM COMPLETE  CPE today Updated tetanus vaccine Labs pending as above Referral for echo due to family history of bicuspid aortic valve   Signed Lamar Blinks, MD  Received her labs 1/30- letter to pt   Your labs are overall very good A1c does not show any sign of diabetes or pre-diabetes Hepatitis C screening is negative as expected Blood count is normal Metabolic profile normal Lipids are overall favorable.   Let me know if you don't hear about your echocardiogram soon, and otherwise we can plan to visit in one year.    Results for orders placed or performed in visit on 09/23/17  CBC  Result Value Ref Range   WBC 6.7 4.0 - 10.5 K/uL   RBC 4.92 3.87 - 5.11 Mil/uL   Platelets 280.0 150.0 - 400.0 K/uL   Hemoglobin 14.3 12.0 - 15.0 g/dL   HCT 42.9 36.0 - 46.0 %   MCV 87.2 78.0 - 100.0 fl   MCHC 33.4 30.0 - 36.0 g/dL   RDW 13.4 11.5 - 15.5 %  Comprehensive metabolic panel  Result Value Ref Range   Sodium 141 135 - 145 mEq/L   Potassium 3.8 3.5 - 5.1 mEq/L   Chloride 105 96 - 112 mEq/L   CO2 28 19 - 32 mEq/L   Glucose, Bld 100 (H) 70 - 99 mg/dL   BUN 11 6 - 23 mg/dL   Creatinine, Ser 0.90 0.40 - 1.20 mg/dL   Total Bilirubin 0.7 0.2 - 1.2 mg/dL   Alkaline Phosphatase 66 39 - 117 U/L   AST 16 0 - 37 U/L   ALT 10 0 - 35 U/L   Total Protein 7.0 6.0 - 8.3 g/dL   Albumin 4.4 3.5 - 5.2 g/dL   Calcium 9.2 8.4 - 10.5 mg/dL   GFR 68.09 >60.00  mL/min  Hemoglobin A1c  Result Value Ref Range   Hgb A1c MFr Bld 5.2 4.6 - 6.5 %  Lipid panel  Result Value Ref Range  Cholesterol 178 0 - 200 mg/dL   Triglycerides 101.0 0.0 - 149.0 mg/dL   HDL 49.60 >39.00 mg/dL   VLDL 20.2 0.0 - 40.0 mg/dL   LDL Cholesterol 109 (H) 0 - 99 mg/dL   Total CHOL/HDL Ratio 4    NonHDL 128.79   Hepatitis C antibody  Result Value Ref Range   Hepatitis C Ab NON-REACTIVE NON-REACTI   SIGNAL TO CUT-OFF 0.01 <1.00

## 2017-09-23 ENCOUNTER — Encounter: Payer: Self-pay | Admitting: Family Medicine

## 2017-09-23 ENCOUNTER — Ambulatory Visit (INDEPENDENT_AMBULATORY_CARE_PROVIDER_SITE_OTHER): Payer: Commercial Managed Care - PPO | Admitting: Family Medicine

## 2017-09-23 ENCOUNTER — Encounter: Payer: Commercial Managed Care - PPO | Admitting: Family Medicine

## 2017-09-23 VITALS — BP 112/82 | HR 59 | Temp 98.0°F | Ht 64.5 in | Wt 151.4 lb

## 2017-09-23 DIAGNOSIS — Z23 Encounter for immunization: Secondary | ICD-10-CM

## 2017-09-23 DIAGNOSIS — Z13220 Encounter for screening for lipoid disorders: Secondary | ICD-10-CM | POA: Diagnosis not present

## 2017-09-23 DIAGNOSIS — Z130 Encounter for screening for diseases of the blood and blood-forming organs and certain disorders involving the immune mechanism: Secondary | ICD-10-CM

## 2017-09-23 DIAGNOSIS — Z1159 Encounter for screening for other viral diseases: Secondary | ICD-10-CM | POA: Diagnosis not present

## 2017-09-23 DIAGNOSIS — Z131 Encounter for screening for diabetes mellitus: Secondary | ICD-10-CM

## 2017-09-23 DIAGNOSIS — Z85828 Personal history of other malignant neoplasm of skin: Secondary | ICD-10-CM | POA: Diagnosis not present

## 2017-09-23 DIAGNOSIS — Z0000 Encounter for general adult medical examination without abnormal findings: Secondary | ICD-10-CM

## 2017-09-23 DIAGNOSIS — Z8279 Family history of other congenital malformations, deformations and chromosomal abnormalities: Secondary | ICD-10-CM | POA: Diagnosis not present

## 2017-09-23 LAB — COMPREHENSIVE METABOLIC PANEL
ALT: 10 U/L (ref 0–35)
AST: 16 U/L (ref 0–37)
Albumin: 4.4 g/dL (ref 3.5–5.2)
Alkaline Phosphatase: 66 U/L (ref 39–117)
BUN: 11 mg/dL (ref 6–23)
CO2: 28 mEq/L (ref 19–32)
Calcium: 9.2 mg/dL (ref 8.4–10.5)
Chloride: 105 mEq/L (ref 96–112)
Creatinine, Ser: 0.9 mg/dL (ref 0.40–1.20)
GFR: 68.09 mL/min (ref >60.00)
Glucose, Bld: 100 mg/dL — ABNORMAL HIGH (ref 70–99)
Potassium: 3.8 mEq/L (ref 3.5–5.1)
Sodium: 141 mEq/L (ref 135–145)
Total Bilirubin: 0.7 mg/dL (ref 0.2–1.2)
Total Protein: 7 g/dL (ref 6.0–8.3)

## 2017-09-23 LAB — LIPID PANEL
Cholesterol: 178 mg/dL (ref 0–200)
HDL: 49.6 mg/dL (ref >39.00)
LDL Cholesterol: 109 mg/dL — ABNORMAL HIGH (ref 0–99)
NonHDL: 128.79
Total CHOL/HDL Ratio: 4
Triglycerides: 101 mg/dL (ref 0.0–149.0)
VLDL: 20.2 mg/dL (ref 0.0–40.0)

## 2017-09-23 LAB — CBC
HCT: 42.9 % (ref 36.0–46.0)
Hemoglobin: 14.3 g/dL (ref 12.0–15.0)
MCHC: 33.4 g/dL (ref 30.0–36.0)
MCV: 87.2 fl (ref 78.0–100.0)
Platelets: 280 10*3/uL (ref 150.0–400.0)
RBC: 4.92 Mil/uL (ref 3.87–5.11)
RDW: 13.4 % (ref 11.5–15.5)
WBC: 6.7 10*3/uL (ref 4.0–10.5)

## 2017-09-23 LAB — HEMOGLOBIN A1C: Hgb A1c MFr Bld: 5.2 % (ref 4.6–6.5)

## 2017-09-23 NOTE — Patient Instructions (Addendum)
It was a pleasure to see you today- I am so sorry to have kept you waiting!   We will be in touch with your labs and I will refer you for an echocardiogram to look at your aortic valve   Health Maintenance for Postmenopausal Women Menopause is a normal process in which your reproductive ability comes to an end. This process happens gradually over a span of months to years, usually between the ages of 72 and 32. Menopause is complete when you have missed 12 consecutive menstrual periods. It is important to talk with your health care provider about some of the most common conditions that affect postmenopausal women, such as heart disease, cancer, and bone loss (osteoporosis). Adopting a healthy lifestyle and getting preventive care can help to promote your health and wellness. Those actions can also lower your chances of developing some of these common conditions. What should I know about menopause? During menopause, you may experience a number of symptoms, such as:  Moderate-to-severe hot flashes.  Night sweats.  Decrease in sex drive.  Mood swings.  Headaches.  Tiredness.  Irritability.  Memory problems.  Insomnia.  Choosing to treat or not to treat menopausal changes is an individual decision that you make with your health care provider. What should I know about hormone replacement therapy and supplements? Hormone therapy products are effective for treating symptoms that are associated with menopause, such as hot flashes and night sweats. Hormone replacement carries certain risks, especially as you become older. If you are thinking about using estrogen or estrogen with progestin treatments, discuss the benefits and risks with your health care provider. What should I know about heart disease and stroke? Heart disease, heart attack, and stroke become more likely as you age. This may be due, in part, to the hormonal changes that your body experiences during menopause. These can affect  how your body processes dietary fats, triglycerides, and cholesterol. Heart attack and stroke are both medical emergencies. There are many things that you can do to help prevent heart disease and stroke:  Have your blood pressure checked at least every 1-2 years. High blood pressure causes heart disease and increases the risk of stroke.  If you are 56-38 years old, ask your health care provider if you should take aspirin to prevent a heart attack or a stroke.  Do not use any tobacco products, including cigarettes, chewing tobacco, or electronic cigarettes. If you need help quitting, ask your health care provider.  It is important to eat a healthy diet and maintain a healthy weight. ? Be sure to include plenty of vegetables, fruits, low-fat dairy products, and lean protein. ? Avoid eating foods that are high in solid fats, added sugars, or salt (sodium).  Get regular exercise. This is one of the most important things that you can do for your health. ? Try to exercise for at least 150 minutes each week. The type of exercise that you do should increase your heart rate and make you sweat. This is known as moderate-intensity exercise. ? Try to do strengthening exercises at least twice each week. Do these in addition to the moderate-intensity exercise.  Know your numbers.Ask your health care provider to check your cholesterol and your blood glucose. Continue to have your blood tested as directed by your health care provider.  What should I know about cancer screening? There are several types of cancer. Take the following steps to reduce your risk and to catch any cancer development as early as possible.  Breast Cancer  Practice breast self-awareness. ? This means understanding how your breasts normally appear and feel. ? It also means doing regular breast self-exams. Let your health care provider know about any changes, no matter how small.  If you are 35 or older, have a clinician do a breast  exam (clinical breast exam or CBE) every year. Depending on your age, family history, and medical history, it may be recommended that you also have a yearly breast X-ray (mammogram).  If you have a family history of breast cancer, talk with your health care provider about genetic screening.  If you are at high risk for breast cancer, talk with your health care provider about having an MRI and a mammogram every year.  Breast cancer (BRCA) gene test is recommended for women who have family members with BRCA-related cancers. Results of the assessment will determine the need for genetic counseling and BRCA1 and for BRCA2 testing. BRCA-related cancers include these types: ? Breast. This occurs in males or females. ? Ovarian. ? Tubal. This may also be called fallopian tube cancer. ? Cancer of the abdominal or pelvic lining (peritoneal cancer). ? Prostate. ? Pancreatic.  Cervical, Uterine, and Ovarian Cancer Your health care provider may recommend that you be screened regularly for cancer of the pelvic organs. These include your ovaries, uterus, and vagina. This screening involves a pelvic exam, which includes checking for microscopic changes to the surface of your cervix (Pap test).  For women ages 21-65, health care providers may recommend a pelvic exam and a Pap test every three years. For women ages 71-65, they may recommend the Pap test and pelvic exam, combined with testing for human papilloma virus (HPV), every five years. Some types of HPV increase your risk of cervical cancer. Testing for HPV may also be done on women of any age who have unclear Pap test results.  Other health care providers may not recommend any screening for nonpregnant women who are considered low risk for pelvic cancer and have no symptoms. Ask your health care provider if a screening pelvic exam is right for you.  If you have had past treatment for cervical cancer or a condition that could lead to cancer, you need Pap  tests and screening for cancer for at least 20 years after your treatment. If Pap tests have been discontinued for you, your risk factors (such as having a new sexual partner) need to be reassessed to determine if you should start having screenings again. Some women have medical problems that increase the chance of getting cervical cancer. In these cases, your health care provider may recommend that you have screening and Pap tests more often.  If you have a family history of uterine cancer or ovarian cancer, talk with your health care provider about genetic screening.  If you have vaginal bleeding after reaching menopause, tell your health care provider.  There are currently no reliable tests available to screen for ovarian cancer.  Lung Cancer Lung cancer screening is recommended for adults 63-66 years old who are at high risk for lung cancer because of a history of smoking. A yearly low-dose CT scan of the lungs is recommended if you:  Currently smoke.  Have a history of at least 30 pack-years of smoking and you currently smoke or have quit within the past 15 years. A pack-year is smoking an average of one pack of cigarettes per day for one year.  Yearly screening should:  Continue until it has been 15 years since  you quit.  Stop if you develop a health problem that would prevent you from having lung cancer treatment.  Colorectal Cancer  This type of cancer can be detected and can often be prevented.  Routine colorectal cancer screening usually begins at age 73 and continues through age 75.  If you have risk factors for colon cancer, your health care provider may recommend that you be screened at an earlier age.  If you have a family history of colorectal cancer, talk with your health care provider about genetic screening.  Your health care provider may also recommend using home test kits to check for hidden blood in your stool.  A small camera at the end of a tube can be used to  examine your colon directly (sigmoidoscopy or colonoscopy). This is done to check for the earliest forms of colorectal cancer.  Direct examination of the colon should be repeated every 5-10 years until age 3. However, if early forms of precancerous polyps or small growths are found or if you have a family history or genetic risk for colorectal cancer, you may need to be screened more often.  Skin Cancer  Check your skin from head to toe regularly.  Monitor any moles. Be sure to tell your health care provider: ? About any new moles or changes in moles, especially if there is a change in a mole's shape or color. ? If you have a mole that is larger than the size of a pencil eraser.  If any of your family members has a history of skin cancer, especially at a young age, talk with your health care provider about genetic screening.  Always use sunscreen. Apply sunscreen liberally and repeatedly throughout the day.  Whenever you are outside, protect yourself by wearing long sleeves, pants, a wide-brimmed hat, and sunglasses.  What should I know about osteoporosis? Osteoporosis is a condition in which bone destruction happens more quickly than new bone creation. After menopause, you may be at an increased risk for osteoporosis. To help prevent osteoporosis or the bone fractures that can happen because of osteoporosis, the following is recommended:  If you are 32-62 years old, get at least 1,000 mg of calcium and at least 600 mg of vitamin D per day.  If you are older than age 72 but younger than age 45, get at least 1,200 mg of calcium and at least 600 mg of vitamin D per day.  If you are older than age 90, get at least 1,200 mg of calcium and at least 800 mg of vitamin D per day.  Smoking and excessive alcohol intake increase the risk of osteoporosis. Eat foods that are rich in calcium and vitamin D, and do weight-bearing exercises several times each week as directed by your health care  provider. What should I know about how menopause affects my mental health? Depression may occur at any age, but it is more common as you become older. Common symptoms of depression include:  Low or sad mood.  Changes in sleep patterns.  Changes in appetite or eating patterns.  Feeling an overall lack of motivation or enjoyment of activities that you previously enjoyed.  Frequent crying spells.  Talk with your health care provider if you think that you are experiencing depression. What should I know about immunizations? It is important that you get and maintain your immunizations. These include:  Tetanus, diphtheria, and pertussis (Tdap) booster vaccine.  Influenza every year before the flu season begins.  Pneumonia vaccine.  Shingles  vaccine.  Your health care provider may also recommend other immunizations. This information is not intended to replace advice given to you by your health care provider. Make sure you discuss any questions you have with your health care provider. Document Released: 10/05/2005 Document Revised: 03/02/2016 Document Reviewed: 05/17/2015 Elsevier Interactive Patient Education  2018 [employer].

## 2017-09-24 LAB — HEPATITIS C ANTIBODY
Hepatitis C Ab: NONREACTIVE
SIGNAL TO CUT-OFF: 0.01 (ref <1.00)

## 2017-09-26 ENCOUNTER — Encounter: Payer: Self-pay | Admitting: Family Medicine

## 2017-09-26 ENCOUNTER — Other Ambulatory Visit: Payer: Self-pay

## 2017-09-26 ENCOUNTER — Ambulatory Visit (HOSPITAL_COMMUNITY): Payer: Commercial Managed Care - PPO | Attending: Cardiology

## 2017-09-26 DIAGNOSIS — Z8249 Family history of ischemic heart disease and other diseases of the circulatory system: Secondary | ICD-10-CM | POA: Diagnosis not present

## 2017-09-26 DIAGNOSIS — Z8279 Family history of other congenital malformations, deformations and chromosomal abnormalities: Secondary | ICD-10-CM | POA: Insufficient documentation

## 2017-10-12 DIAGNOSIS — B349 Viral infection, unspecified: Secondary | ICD-10-CM | POA: Diagnosis not present

## 2017-11-07 ENCOUNTER — Encounter: Payer: Self-pay | Admitting: Medical

## 2017-11-07 ENCOUNTER — Ambulatory Visit (HOSPITAL_BASED_OUTPATIENT_CLINIC_OR_DEPARTMENT_OTHER)
Admission: RE | Admit: 2017-11-07 | Discharge: 2017-11-07 | Disposition: A | Discharge status: Home / Self Care | Payer: Commercial Managed Care - PPO | Source: Ambulatory Visit | Attending: Medical | Admitting: Medical

## 2017-11-07 ENCOUNTER — Ambulatory Visit: Payer: Commercial Managed Care - PPO | Admitting: Medical

## 2017-11-07 VITALS — BP 129/71 | HR 62 | Temp 97.9°F | Resp 16 | Ht 64.5 in | Wt 149.0 lb

## 2017-11-07 DIAGNOSIS — J0100 Acute maxillary sinusitis, unspecified: Secondary | ICD-10-CM

## 2017-11-07 DIAGNOSIS — R059 Cough, unspecified: Secondary | ICD-10-CM

## 2017-11-07 DIAGNOSIS — J40 Bronchitis, not specified as acute or chronic: Secondary | ICD-10-CM

## 2017-11-07 DIAGNOSIS — R0781 Pleurodynia: Secondary | ICD-10-CM

## 2017-11-07 DIAGNOSIS — R05 Cough: Secondary | ICD-10-CM | POA: Insufficient documentation

## 2017-11-07 MED ORDER — BENZONATATE 100 MG PO CAPS: 100.0000 mg | ORAL_CAPSULE | Freq: Three times a day (TID) | ORAL | 0 refills | Status: DC | PRN

## 2017-11-07 MED ORDER — DOXYCYCLINE HYCLATE 100 MG PO TABS: 100.0000 mg | ORAL_TABLET | Freq: Two times a day (BID) | ORAL | 0 refills | Status: DC

## 2017-11-07 MED FILL — BENZONATATE 100 MG CAPSULE: 100 | 10 days supply | Qty: 30 | Fill #0

## 2017-11-07 MED FILL — DOXYCYCLINE HYCLATE 100 MG: 100 | 10 days supply | Qty: 20 | Fill #0

## 2017-11-07 NOTE — Progress Notes (Signed)
Subjective:    Patient ID: Krista Schwartz, female    DOB: 01/25/1956, 62 y.o.   MRN: [id number]  HPI  Pt states 5 weeks ago had the flu and was treated. At urgent care studies were negative but next day tamiflu was written by [facility] doc. She had classic flu like symptoms. Pt states her cough and fatigue never went away completely. Has hoarse voice. Last 5 days she states has felt  nasal congestion and sinus pain. Pt states dry cough. Pt is getting colored mucus from her nose.   Pt has rt side lower rib area pain present for over a week. Hurts when she takes a deep breath. Hurts to lay on rt side at night.   Pt states off and on fatigue whole. Then more fatigue last 5 days. No fever, no chills or sweats in last week.   Review of Systems  Constitutional: Positive for fatigue. Negative for chills, diaphoresis and fever.  HENT: Positive for congestion, sinus pressure and sinus pain.   Respiratory: Positive for cough. Negative for shortness of breath and wheezing.        Pt states about a week or 2 post flu had severe cough. Now cough less and lingering.  Cardiovascular: Negative for chest pain and palpitations.  Gastrointestinal: Negative for abdominal pain.  Musculoskeletal: Negative for back pain and neck pain.       Rt rib region pain.  Skin: Negative for rash.       Skin at base of nail rt middle finger Slight tender for 2 days.   Hematological: Negative for adenopathy. Does not bruise/bleed easily.  Psychiatric/Behavioral: Negative for behavioral problems and confusion.    Past Medical History:  Diagnosis Date  . Cancer (HCC)    basal and squamous cell  . Chicken pox   . GERD (gastroesophageal reflux disease)   . H/O seasonal allergies   . Migraines      Social History   Socioeconomic History  . Marital status: Married    Spouse name: Not on file  . Number of children: Not on file  . Years of education: Not on file  . Highest education level: Not on file  Social  Needs  . Financial resource strain: Not on file  . Food insecurity - worry: Not on file  . Food insecurity - inability: Not on file  . Transportation needs - medical: Not on file  . Transportation needs - non-medical: Not on file  Occupational History  . Occupation: Pharmacist, hospital  Tobacco Use  . Smoking status: Never Smoker  . Smokeless tobacco: Never Used  Substance and Sexual Activity  . Alcohol use: No    Alcohol/week: 0.0 oz  . Drug use: No  . Sexual activity: Yes    Partners: Male  Other Topics Concern  . Not on file  Social History Narrative  . Not on file    Past Surgical History:  Procedure Laterality Date  . TUBAL LIGATION    . WISDOM TOOTH EXTRACTION      Family History  Problem Relation Age of Onset  . Parkinson's disease Mother   . Heart attack Father   . Cancer Paternal Grandfather        skin  . Cancer Maternal Grandmother        skin  . Hypertension Other   . Hyperlipidemia Other     No Known Allergies  Current Outpatient Medications on File Prior to Visit  Medication Sig Dispense Refill  . Cholecalciferol (  VITAMIN D3) 5000 units TABS Take 1 tablet by mouth every other day.    . estradiol (VIVELLE-DOT) 0.05 MG/24HR patch Place 1 patch on skin twice weekly 8 patch 10  . fexofenadine (ALLEGRA) 180 MG tablet Take 180 mg by mouth daily as needed.     . meclizine (ANTIVERT) 25 MG tablet Take 1 tablet (25 mg total) by mouth as needed for dizziness. 30 tablet 1  . progesterone (PROMETRIUM) 200 MG capsule Take 1 capsule (200 mg total) by mouth daily. 60 capsule 7   No current facility-administered medications on file prior to visit.     BP 129/71 (BP Location: Left Arm, Patient Position: Sitting, Cuff Size: Normal)   Pulse 62   Temp 97.9 F (36.6 C) (Oral)   Resp 16   Ht 5' 4.5" (1.638 m)   Wt 149 lb (67.6 kg)   SpO2 100%   BMI 25.18 kg/m       Objective:   Physical Exam  General  Mental Status - Alert. General Appearance - Well groomed. Not in  acute distress.  Skin Rashes- No Rashes.  HEENT Head- Normal. Ear Auditory Canal - Left- Normal. Right - Normal.Tympanic Membrane- Left- Normal. Right- Normal. Eye Sclera/Conjunctiva- Left- Normal. Right- Normal. Nose & Sinuses Nasal Mucosa- Left-  Boggy and Congested. Right-  Boggy and  Congested.Bilateral faint  Maxillary but no frontal sinus pressure. Mouth & Throat Lips: Upper Lip- Normal: no dryness, cracking, pallor, cyanosis, or vesicular eruption. Lower Lip-Normal: no dryness, cracking, pallor, cyanosis or vesicular eruption. Buccal Mucosa- Bilateral- No Aphthous ulcers. Oropharynx- No Discharge or Erythema. +pnd Tonsils: Characteristics- Bilateral- faint Erythema . Size/Enlargement- Bilateral- No enlargement. Discharge- bilateral-None.  Neck Neck- Supple. No Masses.   Chest and Lung Exam Auscultation: Breath Sounds:-Clear even and unlabored.  Cardiovascular Auscultation:Rythm- Regular, rate and rhythm. Murmurs & Other Heart Sounds:Ausculatation of the heart reveal- No Murmurs.  Lymphatic Head & Neck General Head & Neck Lymphatics: Bilateral: Description- No Localized lymphadenopathy.  Hands- bilateral dip swelling of both hands. Rt 3rd finger. Base of nail mild red and tender. Possible panroncychia       Assessment & Plan:  You do appear to have probable sinus infection and bronchitis post flu.  It sounds like he never fully got over illness and probably have secondary infection at this point.  I am prescribing doxycycline antibiotic.  Rx advisement given.  For cough, I am prescribing benzonatate.  We are at the beginning of spring allergy season and would recommend that you continue your Zyrtec.  You do have some right lower rib pain in the light of recent illness I do want to get chest x-ray to rule out any pneumonia of right lower lobe.  Regarding your right third digit slight redness at the base of the nail, you might have a paronychia and this may respond  to doxycycline.  If it has no impact would consider getting arthritis panel studies as some of your distal finger joints do look swollen indicating probable arthritis.  Also regarding right rib area pain please watch for any rash or vesicles/small blister eruption.  If you see any please notify me immediately and I would prescribe you antiviral medication.  Follow-up in 7-10 days or as needed.  Mackie Pai, PA-C

## 2017-11-07 NOTE — Patient Instructions (Addendum)
You do appear to have probable sinus infection and bronchitis post flu.  It sounds like he never fully got over illness and probably has secondary infection at this point.  I am prescribing doxycycline antibiotic.  Rx advisement given.  For cough, I am prescribing benzonatate.  We are at the beginning of spring allergy season and would recommend that you continue your Zyrtec.  You do have some right lower rib pain in the light of recent illness I do want to get chest x-ray to rule out any pneumonia of right lower lobe.  Regarding your right third digit slight redness at the base of the nail, you might have a paronychia and this may respond to doxycycline.  If it has no impact would consider getting arthritis panel studies as some of your distal finger joints do look swollen indicating probable arthritis.  Also regarding right rib area pain please watch for any rash or vesicles/small blister eruption.  If you see any please notify me immediately and I would prescribe you antiviral medication.  Follow-up in 7-10 days or as needed.

## 2017-11-08 ENCOUNTER — Telehealth: Payer: Self-pay | Admitting: Family Medicine

## 2017-11-08 NOTE — Telephone Encounter (Signed)
Copied from [place]. Topic: Quick Communication - See Telephone Encounter >> Nov 08, 2017 10:22 AM [name], NT wrote: CRM for notification. See Telephone encounter for:   11/08/17. Pt. Calling to receive chest x-ray results. Pt. Can be reached at [phone]

## 2018-02-10 ENCOUNTER — Encounter: Payer: Self-pay | Admitting: Obstetrics & Gynecology

## 2018-02-10 ENCOUNTER — Ambulatory Visit (INDEPENDENT_AMBULATORY_CARE_PROVIDER_SITE_OTHER): Payer: Commercial Managed Care - PPO | Admitting: Obstetrics & Gynecology

## 2018-02-10 VITALS — BP 108/67 | HR 59 | Resp 16 | Ht 66.0 in | Wt 150.0 lb

## 2018-02-10 DIAGNOSIS — Z124 Encounter for screening for malignant neoplasm of cervix: Secondary | ICD-10-CM

## 2018-02-10 DIAGNOSIS — Z01419 Encounter for gynecological examination (general) (routine) without abnormal findings: Secondary | ICD-10-CM

## 2018-02-10 DIAGNOSIS — Z1151 Encounter for screening for human papillomavirus (HPV): Secondary | ICD-10-CM

## 2018-02-10 NOTE — Addendum Note (Signed)
Addended by: Asencion Islam on: 02/10/2018 05:15 PM   Modules accepted: Orders

## 2018-02-10 NOTE — Progress Notes (Signed)
Subjective:    Krista Schwartz is a 62 y.o. married P3  female who presents for an annual exam. The patient has no complaints today. She plans to take a break from her HRT. She started them in 2009.  The patient is not currently sexually active for at least 5 years (ED)  GYN screening history: last pap: was normal. The patient wears seatbelts: yes. The patient participates in regular exercise: yes. Has the patient ever been transfused or tattooed?: no. The patient reports that there is not domestic violence in her life.   Menstrual History: OB History    Gravida  3   Para  3   Term  3   Preterm      AB      Living  3     SAB      TAB      Ectopic      Multiple      Live Births              Menarche age: 61 No LMP recorded. Patient is postmenopausal.    The following portions of the patient's history were reviewed and updated as appropriate: allergies, current medications, past family history, past medical history, past social history, past surgical history and problem list.  Review of Systems Pertinent items are noted in HPI.   No breast, gyn, colon cancer in [place] Colonoscopy due at 57 Married for 38 years.    Objective:    BP 108/67   Pulse (!) 59   Resp 16   Ht 5\' 6"  (1.676 m)   Wt 150 lb (68 kg)   BMI 24.21 kg/m   General Appearance:    Alert, cooperative, no distress, appears stated age  Head:    Normocephalic, without obvious abnormality, atraumatic  Eyes:    PERRL, conjunctiva/corneas clear, EOM's intact, fundi    benign, both eyes  Ears:    Normal TM's and external ear canals, both ears  Nose:   Nares normal, septum midline, mucosa normal, no drainage    or sinus tenderness  Throat:   Lips, mucosa, and tongue normal; teeth and gums normal  Neck:   Supple, symmetrical, trachea midline, no adenopathy;    thyroid:  no enlargement/tenderness/nodules; no carotid   bruit or JVD  Back:     Symmetric, no curvature, ROM normal, no CVA tenderness   Lungs:     Clear to auscultation bilaterally, respirations unlabored  Chest Wall:    No tenderness or deformity   Heart:    Regular rate and rhythm, S1 and S2 normal, no murmur, rub   or gallop  Breast Exam:    No tenderness, masses, or nipple abnormality  Abdomen:     Soft, non-tender, bowel sounds active all four quadrants,    no masses, no organomegaly  Genitalia:    Normal female without lesion, discharge or tenderness, normal size and shape, anteverted, mobile, non-tender, normal adnexal exam      Extremities:   Extremities normal, atraumatic, no cyanosis or edema  Pulses:   2+ and symmetric all extremities  Skin:   Skin color, texture, turgor normal, no rashes or lesions  Lymph nodes:   Cervical, supraclavicular, and axillary nodes normal  Neurologic:   CNII-XII intact, normal strength, sensation and reflexes    throughout  .    Assessment:    Healthy female exam.    Plan:     Thin prep Pap smear. with cotesting Discontinue HRT

## 2018-02-13 LAB — CYTOLOGY - PAP
Diagnosis: NEGATIVE
HPV: NOT DETECTED

## 2018-02-28 ENCOUNTER — Other Ambulatory Visit: Payer: Self-pay | Admitting: Obstetrics & Gynecology

## 2018-03-03 ENCOUNTER — Other Ambulatory Visit: Payer: Self-pay | Admitting: Obstetrics & Gynecology

## 2018-03-03 DIAGNOSIS — Z780 Asymptomatic menopausal state: Secondary | ICD-10-CM

## 2018-03-11 ENCOUNTER — Other Ambulatory Visit: Payer: Self-pay | Admitting: *Deleted

## 2018-03-11 DIAGNOSIS — Z780 Asymptomatic menopausal state: Secondary | ICD-10-CM

## 2018-03-11 MED ORDER — ESTRADIOL 0.05 MG/24HR TD PTTW: MEDICATED_PATCH | TRANSDERMAL | 11 refills | Status: DC

## 2018-03-11 MED ORDER — PROGESTERONE MICRONIZED 200 MG PO CAPS: ORAL_CAPSULE | ORAL | 11 refills | Status: DC

## 2018-05-30 ENCOUNTER — Encounter: Payer: Self-pay | Admitting: *Deleted

## 2018-06-05 DIAGNOSIS — L821 Other seborrheic keratosis: Secondary | ICD-10-CM | POA: Diagnosis not present

## 2018-06-05 DIAGNOSIS — L578 Other skin changes due to chronic exposure to nonionizing radiation: Secondary | ICD-10-CM | POA: Diagnosis not present

## 2018-06-05 DIAGNOSIS — L570 Actinic keratosis: Secondary | ICD-10-CM | POA: Diagnosis not present

## 2018-06-05 DIAGNOSIS — L814 Other melanin hyperpigmentation: Secondary | ICD-10-CM | POA: Diagnosis not present

## 2018-09-26 ENCOUNTER — Other Ambulatory Visit (HOSPITAL_BASED_OUTPATIENT_CLINIC_OR_DEPARTMENT_OTHER): Payer: Self-pay | Admitting: Family Medicine

## 2018-09-26 DIAGNOSIS — Z1231 Encounter for screening mammogram for malignant neoplasm of breast: Secondary | ICD-10-CM

## 2018-09-29 ENCOUNTER — Encounter (HOSPITAL_BASED_OUTPATIENT_CLINIC_OR_DEPARTMENT_OTHER): Payer: Commercial Managed Care - PPO

## 2018-09-29 ENCOUNTER — Other Ambulatory Visit (HOSPITAL_BASED_OUTPATIENT_CLINIC_OR_DEPARTMENT_OTHER): Payer: Self-pay | Admitting: Family Medicine

## 2018-09-29 DIAGNOSIS — Z1231 Encounter for screening mammogram for malignant neoplasm of breast: Secondary | ICD-10-CM

## 2018-10-06 ENCOUNTER — Ambulatory Visit (HOSPITAL_BASED_OUTPATIENT_CLINIC_OR_DEPARTMENT_OTHER)
Admission: RE | Admit: 2018-10-06 | Discharge: 2018-10-06 | Disposition: A | Discharge status: Home / Self Care | Payer: Commercial Managed Care - PPO | Source: Ambulatory Visit | Attending: Family Medicine | Admitting: Family Medicine

## 2018-10-06 DIAGNOSIS — Z1231 Encounter for screening mammogram for malignant neoplasm of breast: Secondary | ICD-10-CM | POA: Diagnosis not present

## 2018-10-25 NOTE — Progress Notes (Addendum)
[place] [facility] [address], [place], [place] [address] [phone]  Date:  10/27/2018   Name:  Krista Schwartz   DOB:  [birth year]   MRN:  [id number]  PCP:  Darreld Mclean, MD    Chief Complaint: Annual Exam   History of Present Illness:  Krista Schwartz is a 63 y.o. very pleasant female patient who presents with the following:  Generally healthy lady with history of skin cancer and GERD, migraines Here today for physical She saw her gynecologist, Dr. Hulan Fray, in June Last physical exam here 1 year ago.  At that time she reported little exercise due to lack of time.  Her brother also had an MI was diagnosed with a bicuspid aortic valve We obtained an echo for her a year ago, it was normal except for grade 1 diastolic dysfunction No CP or SOB   Labs: 1 year ago, repeat today.  She did eat today, around 12:30 Immunizations: suggested shingrix but we are out right now  Colon cancer screening: 2011, due next year.  She would like to do cologuard which we can order now  Mammogram: February 2020 Pap: Per GYN, June 2019.  Her GYN doctor is also prescribing hormones Dermatology:seen November; she sees a new doc- Melina Copa, as her last doctor retired She is getting a lot more exercise recently, going to the gym 3x a week to do cardio and weights.  Praised her efforts   She is married to [place]  Her allergies are picking up again- she got a little break from zyrtec but started back once things began blooming again  She did fall once in the fall while she was taking out leaves -tripped over a brick in the dark.  Otherwise no falls, no depression sx  Patient Active Problem List   Diagnosis Date Noted  . History of skin cancer 06/14/2016    Past Medical History:  Diagnosis Date  . Cancer (HCC)    basal and squamous cell  . Chicken pox   . GERD (gastroesophageal reflux disease)   . H/O seasonal allergies   .  Migraines     Past Surgical History:  Procedure Laterality Date  . TUBAL LIGATION    . WISDOM TOOTH EXTRACTION      Social History   Tobacco Use  . Smoking status: Never Smoker  . Smokeless tobacco: Never Used  Substance Use Topics  . Alcohol use: No    Alcohol/week: 0.0 standard drinks  . Drug use: No    Family History  Problem Relation Age of Onset  . Parkinson's disease Mother   . Heart attack Father   . Cancer Paternal Grandfather        skin  . Cancer Maternal Grandmother        skin  . Hypertension Other   . Hyperlipidemia Other     No Known Allergies  Medication list has been reviewed and updated.  Current Outpatient Medications on File Prior to Visit  Medication Sig Dispense Refill  . cetirizine HCl (ZYRTEC) 1 MG/ML solution     . Cholecalciferol (VITAMIN D3) 5000 units TABS Take 1 tablet by mouth every other day.    . estradiol (VIVELLE-DOT) 0.05 MG/24HR patch Place 1 patch BIW 8 patch 11  . meclizine (ANTIVERT) 25 MG tablet Take 1 tablet (25 mg total) by mouth as needed for dizziness. 30 tablet 1  . Multiple Vitamin (MULTIVITAMIN) tablet Take 1  tablet by mouth daily.    . progesterone (PROMETRIUM) 200 MG capsule TAKE 1 CAPSULE BY MOUTH EVERY DAY 30 capsule 11   No current facility-administered medications on file prior to visit.     Review of Systems:  As per HPI- otherwise negative.  Wt Readings from Last 3 Encounters:  10/27/18 151 lb (68.5 kg)  02/10/18 150 lb (68 kg)  11/07/17 149 lb (67.6 kg)   No CP or SOB Overall she is feeling well, happy with life    Physical Examination: Vitals:   10/27/18 1402  BP: 126/70  Pulse: (!) 58  Resp: 16  Temp: 98.1 F (36.7 C)  SpO2: 97%   Vitals:   10/27/18 1402  Weight: 151 lb (68.5 kg)  Height: 5\' 6"  (1.676 m)   Body mass index is 24.37 kg/m. Ideal Body Weight: Weight in (lb) to have BMI = 25: 154.6  GEN: WDWN, NAD, Non-toxic, A & O x 3, looks well, normal weight  HEENT: Atraumatic,  Normocephalic. Neck supple. No masses, No LAD.  Bilateral TM wnl, oropharynx normal.  PEERL,EOMI.    Ears and Nose: No external deformity. CV: RRR, No M/G/R. No JVD. No thrill. No extra heart sounds. PULM: CTA B, no wheezes, crackles, rhonchi. No retractions. No resp. distress. No accessory muscle use. ABD: S, NT, ND, +BS. No rebound. No HSM. EXTR: No c/c/e NEURO Normal gait.  PSYCH: Normally interactive. Conversant. Not depressed or anxious appearing.  Calm demeanor.    Assessment and Plan: Physical exam  Screening for hyperlipidemia - Plan: Lipid panel  Screening for deficiency anemia - Plan: CBC  Screening for diabetes mellitus - Plan: Comprehensive metabolic panel, Hemoglobin A1c  Colon cancer screening  CPE today Ordered cologuard  Labs pending- will need to fill out and fax in form once her labs come in  Encouraged continued exercise shingrix at her convenience   Signed Lamar Blinks, MD  Addendum 3/3, received her labs  Results for orders placed or performed in visit on 10/27/18  CBC  Result Value Ref Range   WBC 7.3 4.0 - 10.5 K/uL   RBC 4.76 3.87 - 5.11 Mil/uL   Platelets 284.0 150.0 - 400.0 K/uL   Hemoglobin 13.9 12.0 - 15.0 g/dL   HCT 41.3 36.0 - 46.0 %   MCV 86.9 78.0 - 100.0 fl   MCHC 33.7 30.0 - 36.0 g/dL   RDW 13.3 11.5 - 15.5 %  Comprehensive metabolic panel  Result Value Ref Range   Sodium 140 135 - 145 mEq/L   Potassium 4.1 3.5 - 5.1 mEq/L   Chloride 104 96 - 112 mEq/L   CO2 27 19 - 32 mEq/L   Glucose, Bld 80 70 - 99 mg/dL   BUN 12 6 - 23 mg/dL   Creatinine, Ser 0.89 0.40 - 1.20 mg/dL   Total Bilirubin 0.3 0.2 - 1.2 mg/dL   Alkaline Phosphatase 71 39 - 117 U/L   AST 12 0 - 37 U/L   ALT 8 0 - 35 U/L   Total Protein 6.3 6.0 - 8.3 g/dL   Albumin 4.2 3.5 - 5.2 g/dL   Calcium 9.2 8.4 - 10.5 mg/dL   GFR 64.65 >60.00 mL/min  Hemoglobin A1c  Result Value Ref Range   Hgb A1c MFr Bld 5.1 4.6 - 6.5 %  Lipid panel  Result Value Ref Range    Cholesterol 187 0 - 200 mg/dL   Triglycerides 138.0 0.0 - 149.0 mg/dL   HDL 42.20 >39.00 mg/dL  VLDL 27.6 0.0 - 40.0 mg/dL   LDL Cholesterol 117 (H) 0 - 99 mg/dL   Total CHOL/HDL Ratio 4    NonHDL 144.35

## 2018-10-27 ENCOUNTER — Ambulatory Visit (INDEPENDENT_AMBULATORY_CARE_PROVIDER_SITE_OTHER): Payer: Commercial Managed Care - PPO | Admitting: Family Medicine

## 2018-10-27 ENCOUNTER — Encounter: Payer: Self-pay | Admitting: Family Medicine

## 2018-10-27 VITALS — BP 126/70 | HR 58 | Temp 98.1°F | Resp 16 | Ht 66.0 in | Wt 151.0 lb

## 2018-10-27 DIAGNOSIS — Z0000 Encounter for general adult medical examination without abnormal findings: Secondary | ICD-10-CM

## 2018-10-27 DIAGNOSIS — Z130 Encounter for screening for diseases of the blood and blood-forming organs and certain disorders involving the immune mechanism: Secondary | ICD-10-CM | POA: Diagnosis not present

## 2018-10-27 DIAGNOSIS — Z131 Encounter for screening for diabetes mellitus: Secondary | ICD-10-CM | POA: Diagnosis not present

## 2018-10-27 DIAGNOSIS — Z13220 Encounter for screening for lipoid disorders: Secondary | ICD-10-CM | POA: Diagnosis not present

## 2018-10-27 DIAGNOSIS — Z1211 Encounter for screening for malignant neoplasm of colon: Secondary | ICD-10-CM

## 2018-10-27 NOTE — Patient Instructions (Signed)
It was great to see you today, as always I will be in touch with your labs, and will fill out your insurance form and fax it once your  labs come in [place] job with the exercise program, keep it up I would recommend you have the shingles vaccine at your convenience, sorry that we were out of this today  We will order Cologuard for you, the test kit should arrive at your home  Health Maintenance, Female Adopting a healthy lifestyle and getting preventive care can go a long way to promote health and wellness. Talk with your health care provider about what schedule of regular examinations is right for you. This is a good chance for you to check in with your provider about disease prevention and staying healthy. In between checkups, there are plenty of things you can do on your own. Experts have done a lot of research about which lifestyle changes and preventive measures are most likely to keep you healthy. Ask your health care provider for more information. Weight and diet Eat a healthy diet  Be sure to include plenty of vegetables, fruits, low-fat dairy products, and lean protein.  Do not eat a lot of foods high in solid fats, added sugars, or salt.  Get regular exercise. This is one of the most important things you can do for your health. ? Most adults should exercise for at least 150 minutes each week. The exercise should increase your heart rate and make you sweat (moderate-intensity exercise). ? Most adults should also do strengthening exercises at least twice a week. This is in addition to the moderate-intensity exercise. Maintain a healthy weight  Body mass index (BMI) is a measurement that can be used to identify possible weight problems. It estimates body fat based on height and weight. Your health care provider can help determine your BMI and help you achieve or maintain a healthy weight.  For females 48 years of age and older: ? A BMI below 18.5 is considered underweight. ? A BMI of  18.5 to 24.9 is normal. ? A BMI of 25 to 29.9 is considered overweight. ? A BMI of 30 and above is considered obese. Watch levels of cholesterol and blood lipids  You should start having your blood tested for lipids and cholesterol at 60 years of age, then have this test every 5 years.  You may need to have your cholesterol levels checked more often if: ? Your lipid or cholesterol levels are high. ? You are older than 61 years of age. ? You are at high risk for heart disease. Cancer screening Lung Cancer  Lung cancer screening is recommended for adults 34-57 years old who are at high risk for lung cancer because of a history of smoking.  A yearly low-dose CT scan of the lungs is recommended for people who: ? Currently smoke. ? Have quit within the past 15 years. ? Have at least a 30-pack-year history of smoking. A pack year is smoking an average of one pack of cigarettes a day for 1 year.  Yearly screening should continue until it has been 15 years since you quit.  Yearly screening should stop if you develop a health problem that would prevent you from having lung cancer treatment. Breast Cancer  Practice breast self-awareness. This means understanding how your breasts normally appear and feel.  It also means doing regular breast self-exams. Let your health care provider know about any changes, no matter how small.  If you are in your  59s or 20s, you should have a clinical breast exam (CBE) by a health care provider every 1-3 years as part of a regular health exam.  If you are 47 or older, have a CBE every year. Also consider having a breast X-ray (mammogram) every year.  If you have a family history of breast cancer, talk to your health care provider about genetic screening.  If you are at high risk for breast cancer, talk to your health care provider about having an MRI and a mammogram every year.  Breast cancer gene (BRCA) assessment is recommended for women who have family  members with BRCA-related cancers. BRCA-related cancers include: ? Breast. ? Ovarian. ? Tubal. ? Peritoneal cancers.  Results of the assessment will determine the need for genetic counseling and BRCA1 and BRCA2 testing. Cervical Cancer Your health care provider may recommend that you be screened regularly for cancer of the pelvic organs (ovaries, uterus, and vagina). This screening involves a pelvic examination, including checking for microscopic changes to the surface of your cervix (Pap test). You may be encouraged to have this screening done every 3 years, beginning at age 85.  For women ages 25-65, health care providers may recommend pelvic exams and Pap testing every 3 years, or they may recommend the Pap and pelvic exam, combined with testing for human papilloma virus (HPV), every 5 years. Some types of HPV increase your risk of cervical cancer. Testing for HPV may also be done on women of any age with unclear Pap test results.  Other health care providers may not recommend any screening for nonpregnant women who are considered low risk for pelvic cancer and who do not have symptoms. Ask your health care provider if a screening pelvic exam is right for you.  If you have had past treatment for cervical cancer or a condition that could lead to cancer, you need Pap tests and screening for cancer for at least 20 years after your treatment. If Pap tests have been discontinued, your risk factors (such as having a new sexual partner) need to be reassessed to determine if screening should resume. Some women have medical problems that increase the chance of getting cervical cancer. In these cases, your health care provider may recommend more frequent screening and Pap tests. Colorectal Cancer  This type of cancer can be detected and often prevented.  Routine colorectal cancer screening usually begins at 61 years of age and continues through 61 years of age.  Your health care provider may recommend  screening at an earlier age if you have risk factors for colon cancer.  Your health care provider may also recommend using home test kits to check for hidden blood in the stool.  A small camera at the end of a tube can be used to examine your colon directly (sigmoidoscopy or colonoscopy). This is done to check for the earliest forms of colorectal cancer.  Routine screening usually begins at age 16.  Direct examination of the colon should be repeated every 5-10 years through 61 years of age. However, you may need to be screened more often if early forms of precancerous polyps or small growths are found. Skin Cancer  Check your skin from head to toe regularly.  Tell your health care provider about any new moles or changes in moles, especially if there is a change in a mole's shape or color.  Also tell your health care provider if you have a mole that is larger than the size of a pencil eraser.  Always use sunscreen. Apply sunscreen liberally and repeatedly throughout the day.  Protect yourself by wearing long sleeves, pants, a wide-brimmed hat, and sunglasses whenever you are outside. Heart disease, diabetes, and high blood pressure  High blood pressure causes heart disease and increases the risk of stroke. High blood pressure is more likely to develop in: ? People who have blood pressure in the high end of the normal range (130-139/85-89 mm Hg). ? People who are overweight or obese. ? People who are African American.  If you are 40-74 years of age, have your blood pressure checked every 3-5 years. If you are 54 years of age or older, have your blood pressure checked every year. You should have your blood pressure measured twice-once when you are at a hospital or clinic, and once when you are not at a hospital or clinic. Record the average of the two measurements. To check your blood pressure when you are not at a hospital or clinic, you can use: ? An automated blood pressure machine at a  pharmacy. ? A home blood pressure monitor.  If you are between 65 years and 29 years old, ask your health care provider if you should take aspirin to prevent strokes.  Have regular diabetes screenings. This involves taking a blood sample to check your fasting blood sugar level. ? If you are at a normal weight and have a low risk for diabetes, have this test once every three years after 61 years of age. ? If you are overweight and have a high risk for diabetes, consider being tested at a younger age or more often. Preventing infection Hepatitis B  If you have a higher risk for hepatitis B, you should be screened for this virus. You are considered at high risk for hepatitis B if: ? You were born in a country where hepatitis B is common. Ask your health care provider which countries are considered high risk. ? Your parents were born in a high-risk country, and you have not been immunized against hepatitis B (hepatitis B vaccine). ? You have HIV or AIDS. ? You use needles to inject street drugs. ? You live with someone who has hepatitis B. ? You have had sex with someone who has hepatitis B. ? You get hemodialysis treatment. ? You take certain medicines for conditions, including cancer, organ transplantation, and autoimmune conditions. Hepatitis C  Blood testing is recommended for: ? Everyone born from [birth year] through 1963. ? Anyone with known risk factors for hepatitis C. Sexually transmitted infections (STIs)  You should be screened for sexually transmitted infections (STIs) including gonorrhea and chlamydia if: ? You are sexually active and are younger than 61 years of age. ? You are older than 61 years of age and your health care provider tells you that you are at risk for this type of infection. ? Your sexual activity has changed since you were last screened and you are at an increased risk for chlamydia or gonorrhea. Ask your health care provider if you are at risk.  If you do not have  HIV, but are at risk, it may be recommended that you take a prescription medicine daily to prevent HIV infection. This is called pre-exposure prophylaxis (PrEP). You are considered at risk if: ? You are sexually active and do not regularly use condoms or know the HIV status of your partner(s). ? You take drugs by injection. ? You are sexually active with a partner who has HIV. Talk with your health care provider  about whether you are at high risk of being infected with HIV. If you choose to begin PrEP, you should first be tested for HIV. You should then be tested every 3 months for as long as you are taking PrEP. Pregnancy  If you are premenopausal and you may become pregnant, ask your health care provider about preconception counseling.  If you may become pregnant, take 400 to 800 micrograms (mcg) of folic acid every day.  If you want to prevent pregnancy, talk to your health care provider about birth control (contraception). Osteoporosis and menopause  Osteoporosis is a disease in which the bones lose minerals and strength with aging. This can result in serious bone fractures. Your risk for osteoporosis can be identified using a bone density scan.  If you are 13 years of age or older, or if you are at risk for osteoporosis and fractures, ask your health care provider if you should be screened.  Ask your health care provider whether you should take a calcium or vitamin D supplement to lower your risk for osteoporosis.  Menopause may have certain physical symptoms and risks.  Hormone replacement therapy may reduce some of these symptoms and risks. Talk to your health care provider about whether hormone replacement therapy is right for you. Follow these instructions at home:  Schedule regular health, dental, and eye exams.  Stay current with your immunizations.  Do not use any tobacco products including cigarettes, chewing tobacco, or electronic cigarettes.  If you are pregnant, do not  drink alcohol.  If you are breastfeeding, limit how much and how often you drink alcohol.  Limit alcohol intake to no more than 1 drink per day for nonpregnant women. One drink equals 12 ounces of beer, 5 ounces of wine, or 1 ounces of hard liquor.  Do not use street drugs.  Do not share needles.  Ask your health care provider for help if you need support or information about quitting drugs.  Tell your health care provider if you often feel depressed.  Tell your health care provider if you have ever been abused or do not feel safe at home. This information is not intended to replace advice given to you by your health care provider. Make sure you discuss any questions you have with your health care provider. Document Released: 02/26/2011 Document Revised: 01/19/2016 Document Reviewed: 05/17/2015 Elsevier Interactive Patient Education  2019 [employer].

## 2018-10-28 ENCOUNTER — Encounter: Payer: Self-pay | Admitting: Family Medicine

## 2018-10-28 LAB — COMPREHENSIVE METABOLIC PANEL
ALT: 8 U/L (ref 0–35)
AST: 12 U/L (ref 0–37)
Albumin: 4.2 g/dL (ref 3.5–5.2)
Alkaline Phosphatase: 71 U/L (ref 39–117)
BUN: 12 mg/dL (ref 6–23)
CO2: 27 mEq/L (ref 19–32)
Calcium: 9.2 mg/dL (ref 8.4–10.5)
Chloride: 104 mEq/L (ref 96–112)
Creatinine, Ser: 0.89 mg/dL (ref 0.40–1.20)
GFR: 64.65 mL/min (ref >60.00)
Glucose, Bld: 80 mg/dL (ref 70–99)
Potassium: 4.1 mEq/L (ref 3.5–5.1)
Sodium: 140 mEq/L (ref 135–145)
Total Bilirubin: 0.3 mg/dL (ref 0.2–1.2)
Total Protein: 6.3 g/dL (ref 6.0–8.3)

## 2018-10-28 LAB — CBC
HCT: 41.3 % (ref 36.0–46.0)
Hemoglobin: 13.9 g/dL (ref 12.0–15.0)
MCHC: 33.7 g/dL (ref 30.0–36.0)
MCV: 86.9 fl (ref 78.0–100.0)
Platelets: 284 10*3/uL (ref 150.0–400.0)
RBC: 4.76 Mil/uL (ref 3.87–5.11)
RDW: 13.3 % (ref 11.5–15.5)
WBC: 7.3 10*3/uL (ref 4.0–10.5)

## 2018-10-28 LAB — LIPID PANEL
Cholesterol: 187 mg/dL (ref 0–200)
HDL: 42.2 mg/dL (ref >39.00)
LDL Cholesterol: 117 mg/dL — ABNORMAL HIGH (ref 0–99)
NonHDL: 144.35
Total CHOL/HDL Ratio: 4
Triglycerides: 138 mg/dL (ref 0.0–149.0)
VLDL: 27.6 mg/dL (ref 0.0–40.0)

## 2018-10-28 LAB — HEMOGLOBIN A1C: Hgb A1c MFr Bld: 5.1 % (ref 4.6–6.5)

## 2018-11-06 ENCOUNTER — Other Ambulatory Visit: Payer: Self-pay

## 2018-11-06 ENCOUNTER — Encounter: Payer: Self-pay | Admitting: Medical

## 2018-11-06 ENCOUNTER — Ambulatory Visit: Payer: Commercial Managed Care - PPO | Admitting: Medical

## 2018-11-06 VITALS — BP 143/75 | HR 57 | Temp 98.3°F | Resp 16 | Ht 66.0 in | Wt 150.2 lb

## 2018-11-06 DIAGNOSIS — J301 Allergic rhinitis due to pollen: Secondary | ICD-10-CM | POA: Diagnosis not present

## 2018-11-06 DIAGNOSIS — J0100 Acute maxillary sinusitis, unspecified: Secondary | ICD-10-CM | POA: Diagnosis not present

## 2018-11-06 DIAGNOSIS — R05 Cough: Secondary | ICD-10-CM | POA: Diagnosis not present

## 2018-11-06 DIAGNOSIS — R059 Cough, unspecified: Secondary | ICD-10-CM

## 2018-11-06 MED ORDER — FLUTICASONE PROPIONATE 50 MCG/ACT NA SUSP: 2.0000 | Freq: Every day | NASAL | 1 refills | Status: DC

## 2018-11-06 MED ORDER — DOXYCYCLINE HYCLATE 100 MG PO TABS: 100.0000 mg | ORAL_TABLET | Freq: Two times a day (BID) | ORAL | 0 refills | Status: DC

## 2018-11-06 MED ORDER — BENZONATATE 100 MG PO CAPS: 100.0000 mg | ORAL_CAPSULE | Freq: Three times a day (TID) | ORAL | 0 refills | Status: DC | PRN

## 2018-11-06 MED FILL — FLUTICASONE PROP 50 MCG SPR: 50 | 30 days supply | Qty: 16 | Fill #0

## 2018-11-06 MED FILL — BENZONATATE 100 MG CAP: 100 | 10 days supply | Qty: 30 | Fill #0

## 2018-11-06 MED FILL — DOXYCYCLINE HYCLATE 100 MG: 100 | 10 days supply | Qty: 20 | Fill #0

## 2018-11-06 NOTE — Patient Instructions (Addendum)
You appear to have probable allergic rhinitis symptoms recently versus possible upper respiratory infectious symptoms.  Symptoms have  been 9 days since onset of your symptoms and you do have some sinus pressure on exam.  I would recommend that you continue Allegra and will add Flonase nasal spray to take pressure off your sinuses.  If your sinus pressure/pain persist despite above measures or if you start to get productive cough with worse chest congestion then recommend starting doxycycline antibiotic.  Rx advisement given.  For cough, I prescribed benzonatate.  Follow-up 7 to 10 days for any lingering/persisting symptoms or sooner for worsening or changing symptoms.

## 2018-11-06 NOTE — Progress Notes (Signed)
Subjective:    Patient ID: Krista Schwartz, female    DOB: 10-Mar-1956, 63 y.o.   MRN: [id number]  HPI  Pt in sick since 10-28-2018. Pt has hoarse voice and some st. Pt has a lot of nasal congestion. Some sinus pressure. Ears have been hurting. Has mostly dry cough.  Pt has not traveled.  No fever, no chills, no sweats or body aches.  Pt is on allegra on Tuesday.     Review of Systems  Constitutional: Negative for chills, fatigue and fever.  HENT: Positive for congestion, postnasal drip, sinus pressure, sinus pain and sneezing.   Respiratory: Positive for cough. Negative for chest tightness, shortness of breath and wheezing.   Cardiovascular: Negative for chest pain and palpitations.  Gastrointestinal: Negative for abdominal pain.  Musculoskeletal: Negative for back pain.  Hematological: Negative for adenopathy. Does not bruise/bleed easily.  Psychiatric/Behavioral: Negative for behavioral problems and confusion.   Past Medical History:  Diagnosis Date  . Cancer (HCC)    basal and squamous cell  . Chicken pox   . GERD (gastroesophageal reflux disease)   . H/O seasonal allergies   . Migraines      Social History   Socioeconomic History  . Marital status: Married    Spouse name: Not on file  . Number of children: Not on file  . Years of education: Not on file  . Highest education level: Not on file  Occupational History  . Occupation: Pharmacist, hospital  Social Needs  . Financial resource strain: Not on file  . Food insecurity:    Worry: Not on file    Inability: Not on file  . Transportation needs:    Medical: Not on file    Non-medical: Not on file  Tobacco Use  . Smoking status: Never Smoker  . Smokeless tobacco: Never Used  Substance and Sexual Activity  . Alcohol use: No    Alcohol/week: 0.0 standard drinks  . Drug use: No  . Sexual activity: Yes    Partners: Male    Birth control/protection: None  Lifestyle  . Physical activity:    Days per week: Not on  file    Minutes per session: Not on file  . Stress: Not on file  Relationships  . Social connections:    Talks on phone: Not on file    Gets together: Not on file    Attends religious service: Not on file    Active member of club or organization: Not on file    Attends meetings of clubs or organizations: Not on file    Relationship status: Not on file  . Intimate partner violence:    Fear of current or ex partner: Not on file    Emotionally abused: Not on file    Physically abused: Not on file    Forced sexual activity: Not on file  Other Topics Concern  . Not on file  Social History Narrative  . Not on file    Past Surgical History:  Procedure Laterality Date  . TUBAL LIGATION    . WISDOM TOOTH EXTRACTION      Family History  Problem Relation Age of Onset  . Parkinson's disease Mother   . Heart attack Father   . Cancer Paternal Grandfather        skin  . Cancer Maternal Grandmother        skin  . Hypertension Other   . Hyperlipidemia Other     No Known Allergies  Current Outpatient Medications  on File Prior to Visit  Medication Sig Dispense Refill  . cetirizine HCl (ZYRTEC) 1 MG/ML solution     . Cholecalciferol (VITAMIN D3) 5000 units TABS Take 1 tablet by mouth every other day.    . estradiol (VIVELLE-DOT) 0.05 MG/24HR patch Place 1 patch BIW 8 patch 11  . meclizine (ANTIVERT) 25 MG tablet Take 1 tablet (25 mg total) by mouth as needed for dizziness. 30 tablet 1  . Multiple Vitamin (MULTIVITAMIN) tablet Take 1 tablet by mouth daily.    . progesterone (PROMETRIUM) 200 MG capsule TAKE 1 CAPSULE BY MOUTH EVERY DAY 30 capsule 11   No current facility-administered medications on file prior to visit.     BP (!) 143/75   Pulse (!) 57   Temp 98.3 F (36.8 C) (Oral)   Resp 16   Ht 5\' 6"  (1.676 m)   Wt 150 lb 3.2 oz (68.1 kg)   SpO2 100%   BMI 24.24 kg/m       Objective:   Physical Exam  General  Mental Status - Alert. General Appearance - Well  groomed. Not in acute distress.  Skin Rashes- No Rashes.  HEENT Head- Normal. Ear Auditory Canal - Left- Normal. Right - Normal.Tympanic Membrane- Left- Normal. Right- Normal. Eye Sclera/Conjunctiva- Left- Normal. Right- Normal. Nose & Sinuses Nasal Mucosa- Left-  Boggy and Congested. Right-  Boggy and  Congested.Bilateral maxillary and frontal sinus pressure. Mouth & Throat Lips: Upper Lip- Normal: no dryness, cracking, pallor, cyanosis, or vesicular eruption. Lower Lip-Normal: no dryness, cracking, pallor, cyanosis or vesicular eruption. Buccal Mucosa- Bilateral- No Aphthous ulcers. Oropharynx- No Discharge or Erythema. Tonsils: Characteristics- Bilateral- No Erythema or Congestion. Size/Enlargement- Bilateral- No enlargement. Discharge- bilateral-None.  Neck Neck- Supple. No Masses.   Chest and Lung Exam Auscultation: Breath Sounds:-Clear even and unlabored.  Cardiovascular Auscultation:Rythm- Regular, rate and rhythm. Murmurs & Other Heart Sounds:Ausculatation of the heart reveal- No Murmurs.  Lymphatic Head & Neck General Head & Neck Lymphatics: Bilateral: Description- No Localized lymphadenopathy.       Assessment & Plan:  You appear to have probable allergic rhinitis symptoms recently versus possible upper respiratory infectious symptoms.  Symptom  been 9 days since onset of your symptoms and you do have some sinus pressure on exam.  I would recommend that you continue Allegra and will add Flonase nasal spray to take pressure off your sinuses.  If your sinus pressure/pain persist despite above measures or if you start to get productive cough with worse chest congestion then recommend starting doxycycline antibiotic.  Rx advisement given.  For cough, I prescribed benzonatate.  Follow-up 7 to 10 days for any lingering/persisting symptoms or sooner for worsening or changing symptoms.  Mackie Pai, PA-C

## 2018-11-10 DIAGNOSIS — Z1211 Encounter for screening for malignant neoplasm of colon: Secondary | ICD-10-CM | POA: Diagnosis not present

## 2018-11-10 DIAGNOSIS — Z1212 Encounter for screening for malignant neoplasm of rectum: Secondary | ICD-10-CM | POA: Diagnosis not present

## 2018-11-11 LAB — COLOGUARD: Cologuard: NEGATIVE

## 2018-11-19 ENCOUNTER — Encounter: Payer: Self-pay | Admitting: Family Medicine

## 2018-11-20 ENCOUNTER — Encounter: Payer: Self-pay | Admitting: Family Medicine

## 2018-12-02 ENCOUNTER — Encounter: Payer: Self-pay | Admitting: Family Medicine

## 2019-03-17 ENCOUNTER — Other Ambulatory Visit: Payer: Self-pay | Admitting: Obstetrics & Gynecology

## 2019-03-19 ENCOUNTER — Other Ambulatory Visit: Payer: Self-pay | Admitting: Obstetrics & Gynecology

## 2019-03-19 DIAGNOSIS — Z780 Asymptomatic menopausal state: Secondary | ICD-10-CM

## 2019-05-28 DIAGNOSIS — U071 COVID-19: Secondary | ICD-10-CM

## 2019-05-28 HISTORY — DX: COVID-19: U07.1

## 2019-06-01 ENCOUNTER — Telehealth: Payer: Self-pay | Admitting: *Deleted

## 2019-06-01 ENCOUNTER — Encounter: Payer: Self-pay | Admitting: Family Medicine

## 2019-06-01 ENCOUNTER — Other Ambulatory Visit: Payer: Self-pay

## 2019-06-01 ENCOUNTER — Ambulatory Visit (INDEPENDENT_AMBULATORY_CARE_PROVIDER_SITE_OTHER): Payer: Commercial Managed Care - PPO | Admitting: Family Medicine

## 2019-06-01 DIAGNOSIS — J3489 Other specified disorders of nose and nasal sinuses: Secondary | ICD-10-CM | POA: Diagnosis not present

## 2019-06-01 DIAGNOSIS — R509 Fever, unspecified: Secondary | ICD-10-CM | POA: Diagnosis not present

## 2019-06-01 DIAGNOSIS — Z20822 Contact with and (suspected) exposure to covid-19: Secondary | ICD-10-CM

## 2019-06-01 MED ORDER — AMOXICILLIN 500 MG PO CAPS: 1000.0000 mg | ORAL_CAPSULE | Freq: Two times a day (BID) | ORAL | 0 refills | Status: DC

## 2019-06-01 MED FILL — AMOXICILLIN 500 MG CAPSULE: 500 | 10 days supply | Qty: 40 | Fill #0

## 2019-06-01 NOTE — Progress Notes (Signed)
[place] [facility] [address], [place], [place] [address] [phone]  Date:  06/01/2019   Name:  Krista Schwartz   DOB:  09-12-1955   MRN:  [id number]  PCP:  Darreld Mclean, MD    Chief Complaint: No chief complaint on file.   History of Present Illness:  Krista Schwartz is a 63 y.o. very pleasant female patient who presents with the following:  Virtual visit today for this generally healthy woman - she is not feeling well Pt is at home, provider is at office Patient identity confirmed with 2 factors, she gives consent for virtual visit today She notes possible sinus infection for the last 3 days or so Her voice is coming and going  Temp of 99.5 last night -no other fevers noted  She notes sinus pressure and headache, right ear is tender Some scratchy throat- not severe  No GI symptoms  Mild cough- she is beginning to bring up some mucus   She is taking zyrtec but not her flonase Did start on mucinex last night   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  Encouraged her to be tested for COVID-19, she is willing to do so Patient Active Problem List   Diagnosis Date Noted  . History of skin cancer 06/14/2016    Past Medical History:  Diagnosis Date  . Cancer (HCC)    basal and squamous cell  . Chicken pox   . GERD (gastroesophageal reflux disease)   . H/O seasonal allergies   . Migraines     Past Surgical History:  Procedure Laterality Date  . TUBAL LIGATION    . WISDOM TOOTH EXTRACTION      Social History   Tobacco Use  . Smoking status: Never Smoker  . Smokeless tobacco: Never Used  Substance Use Topics  . Alcohol use: No    Alcohol/week: 0.0 standard drinks  . Drug use: No    Family History  Problem Relation Age of Onset  . Parkinson's disease Mother   . Heart attack Father   . Cancer Paternal Grandfather        skin  . Cancer Maternal Grandmother    skin  . Hypertension Other   . Hyperlipidemia Other     No Known Allergies  Medication list has been reviewed and updated.  Current Outpatient Medications on File Prior to Visit  Medication Sig Dispense Refill  . benzonatate (TESSALON) 100 MG capsule Take 1 capsule (100 mg total) by mouth 3 (three) times daily as needed for cough. 30 capsule 0  . cetirizine HCl (ZYRTEC) 1 MG/ML solution     . Cholecalciferol (VITAMIN D3) 5000 units TABS Take 1 tablet by mouth every other day.    Marland Kitchen doxycycline (VIBRA-TABS) 100 MG tablet Take 1 tablet (100 mg total) by mouth 2 (two) times daily. Can give caps or generic. 20 tablet 0  . estradiol (VIVELLE-DOT) 0.05 MG/24HR patch PLACE 1 PATCH ON SKIN TWICE WEEKLY 8 patch 11  . fluticasone (FLONASE) 50 MCG/ACT nasal spray Place 2 sprays into both nostrils daily. 16 g 1  . meclizine (ANTIVERT) 25 MG tablet Take 1 tablet (25 mg total) by mouth as needed for dizziness. 30 tablet 1  . Multiple Vitamin (MULTIVITAMIN) tablet Take 1 tablet by mouth daily.    . progesterone (PROMETRIUM) 200 MG capsule TAKE 1 CAPSULE BY MOUTH EVERY DAY 30 capsule 11   No  current facility-administered medications on file prior to visit.     Review of Systems:  She is otherwise generally healthy  No chest pain or shortness of breath  Physical Examination: There were no vitals filed for this visit. There were no vitals filed for this visit. There is no height or weight on file to calculate BMI. Ideal Body Weight:    Pt observed over video She looks well No other vitals checked at home   Assessment and Plan: Sinus pressure - Plan: amoxicillin (AMOXIL) 500 MG capsule  Low grade fever - Plan: Novel Coronavirus, NAA ([facility])  Virtual visit today for mild upper respiratory infection.  May be sinus infection, called in prescription for amoxicillin.  However, given current COVID-19 pandemic also recommended patient be tested.  Went over testing procedure in order test for  today.  Advised her to self quarantine at home until results are back.  If she is getting worse, or if any question she is to contact me or otherwise seek care  Signed Lamar Blinks, MD

## 2019-06-01 NOTE — Telephone Encounter (Signed)
Patient has appointment for today.  Who Is Calling Patient / Member / Family / Caregiver Call Type Triage / Clinical Relationship To Patient Self Return Phone Number [phone] (Primary) Chief Complaint Hoarseness (non urgent symptom) Reason for Call Symptomatic / Request for Health Information Initial Comment Caller states she is having a cough, a fever of 99.6, hoarseness in throat, and nasal congestion. Caller was wondering about a [phone] test as well. Translation No Nurse Assessment Nurse: Self, RN, Nira Conn Date/Time (Eastern Time): 05/31/2019 6:03:06 PM Confirm and document reason for call. If symptomatic, describe symptoms. ---Caller says she has a cough and sinus congestion, scratchy throat with hoarseness, Fever of 99.1 to 100 orally all symptoms started yesterday.

## 2019-06-03 ENCOUNTER — Encounter: Payer: Self-pay | Admitting: Family Medicine

## 2019-06-03 LAB — NOVEL CORONAVIRUS, NAA: SARS-CoV-2, NAA: DETECTED — AB

## 2019-06-04 NOTE — Telephone Encounter (Signed)
Patient is wanting to know if she is to continue antiboitcs?

## 2019-06-04 NOTE — Telephone Encounter (Signed)
Copied from [place] [id number]. Topic: General - Other >> Jun 04, 2019  2:33 PM Rainey Pines A wrote: Patient would like a callback from nurse in regards to continuing to take antibiotic for covid.

## 2019-06-12 ENCOUNTER — Other Ambulatory Visit: Payer: Self-pay

## 2019-06-12 DIAGNOSIS — Z20822 Contact with and (suspected) exposure to covid-19: Secondary | ICD-10-CM

## 2019-06-15 LAB — NOVEL CORONAVIRUS, NAA: SARS-CoV-2, NAA: NOT DETECTED

## 2019-06-25 ENCOUNTER — Ambulatory Visit (INDEPENDENT_AMBULATORY_CARE_PROVIDER_SITE_OTHER): Payer: Commercial Managed Care - PPO | Admitting: Obstetrics & Gynecology

## 2019-06-25 ENCOUNTER — Other Ambulatory Visit: Payer: Self-pay

## 2019-06-25 ENCOUNTER — Encounter: Payer: Self-pay | Admitting: Obstetrics & Gynecology

## 2019-06-25 VITALS — BP 114/74 | HR 77 | Ht 65.0 in | Wt 151.0 lb

## 2019-06-25 DIAGNOSIS — Z124 Encounter for screening for malignant neoplasm of cervix: Secondary | ICD-10-CM | POA: Diagnosis not present

## 2019-06-25 DIAGNOSIS — Z1151 Encounter for screening for human papillomavirus (HPV): Secondary | ICD-10-CM

## 2019-06-25 DIAGNOSIS — Z01419 Encounter for gynecological examination (general) (routine) without abnormal findings: Secondary | ICD-10-CM

## 2019-06-25 DIAGNOSIS — Z780 Asymptomatic menopausal state: Secondary | ICD-10-CM

## 2019-06-25 MED ORDER — ESTRADIOL 0.05 MG/24HR TD PTTW: MEDICATED_PATCH | TRANSDERMAL | 6 refills | Status: DC

## 2019-06-25 MED ORDER — PROGESTERONE MICRONIZED 200 MG PO CAPS: 200.0000 mg | ORAL_CAPSULE | Freq: Every day | ORAL | 6 refills | Status: DC

## 2019-06-25 NOTE — Progress Notes (Signed)
Last pap- 01/31/18- negative Last mammogram- 10/06/18- normal

## 2019-06-25 NOTE — Progress Notes (Signed)
Subjective:    Krista Schwartz is a 63 y.o. (P3- daughters) who presents for an annual exam. The patient has no complaints today. She tried being off of HRT for about a month but the hot flashes were too much and she restarted. The patient is not currently sexually active due to husband's ED. GYN screening history: last pap: was normal. The patient wears seatbelts: yes. The patient participates in regular exercise: yes. (walking) Has the patient ever been transfused or tattooed?: no. The patient reports that there is not domestic violence in her life.   Menstrual History: OB History    Gravida  3   Para  3   Term  3   Preterm      AB      Living  3     SAB      TAB      Ectopic      Multiple      Live Births              Menarche age: 27 No LMP recorded. Patient is postmenopausal.    The following portions of the patient's history were reviewed and updated as appropriate: allergies, current medications, past family history, past medical history, past social history, past surgical history and problem list.  Review of Systems Pertinent items are noted in HPI.   FH- no breast/gyn/colon cancer, + melanoma in her father Married for 39 years She had flu vaccine at [facility]. Mammogram 1/20 [facility] in 2020   Objective:    BP 114/74   Pulse 77   Ht 5\' 5"  (1.651 m)   Wt 151 lb (68.5 kg)   BMI 25.13 kg/m   General Appearance:    Alert, cooperative, no distress, appears stated age  Head:    Normocephalic, without obvious abnormality, atraumatic  Eyes:    PERRL, conjunctiva/corneas clear, EOM's intact, fundi    benign, both eyes  Ears:    Normal TM's and external ear canals, both ears  Nose:   Nares normal, septum midline, mucosa normal, no drainage    or sinus tenderness  Throat:   Lips, mucosa, and tongue normal; teeth and gums normal  Neck:   Supple, symmetrical, trachea midline, no adenopathy;    thyroid:  no enlargement/tenderness/nodules; no carotid   bruit  or JVD  Back:     Symmetric, no curvature, ROM normal, no CVA tenderness  Lungs:     Clear to auscultation bilaterally, respirations unlabored  Chest Wall:    No tenderness or deformity   Heart:    Regular rate and rhythm, S1 and S2 normal, no murmur, rub   or gallop  Breast Exam:    No tenderness, masses, or nipple abnormality  Abdomen:     Soft, non-tender, bowel sounds active all four quadrants,    no masses, no organomegaly  Genitalia:    Normal female without lesion, discharge or tendernes, normal size and shape, anteverted, mobile, non-tender, normal adnexal exam     Extremities:   Extremities normal, atraumatic, no cyanosis or edema  Pulses:   2+ and symmetric all extremities  Skin:   Skin color, texture, turgor normal, no rashes or lesions  Lymph nodes:   Cervical, supraclavicular, and axillary nodes normal  Neurologic:   CNII-XII intact, normal strength, sensation and reflexes    throughout  .    Assessment:    Healthy female exam.    Plan:     Thin prep Pap smear. with cotesting (we  discussed ACOG recs) I have refilled her HRT ( we discussed ACOG recs)

## 2019-07-01 LAB — CYTOLOGY - PAP
Comment: NEGATIVE
Diagnosis: NEGATIVE
High risk HPV: NEGATIVE

## 2019-11-06 ENCOUNTER — Other Ambulatory Visit (HOSPITAL_BASED_OUTPATIENT_CLINIC_OR_DEPARTMENT_OTHER): Payer: Self-pay | Admitting: Family Medicine

## 2019-11-06 DIAGNOSIS — Z1231 Encounter for screening mammogram for malignant neoplasm of breast: Secondary | ICD-10-CM

## 2019-11-12 ENCOUNTER — Ambulatory Visit (HOSPITAL_BASED_OUTPATIENT_CLINIC_OR_DEPARTMENT_OTHER)
Admission: RE | Admit: 2019-11-12 | Discharge: 2019-11-12 | Disposition: A | Discharge status: Home / Self Care | Payer: Commercial Managed Care - PPO | Source: Ambulatory Visit | Attending: Family Medicine | Admitting: Family Medicine

## 2019-11-12 ENCOUNTER — Other Ambulatory Visit: Payer: Self-pay

## 2019-11-12 DIAGNOSIS — Z1231 Encounter for screening mammogram for malignant neoplasm of breast: Secondary | ICD-10-CM | POA: Insufficient documentation

## 2019-12-20 NOTE — Patient Instructions (Addendum)
It was a pleasure to see you again today!  I will be in touch with your labs as soon as possible and will complete and fax in your forms  Please get your shingrix vaccine at your convenience    Studies show that some cases of Parkinson's disease are caused by genetic mutations. Hereditary causes of this disease are rare. Only 15 percent of those who have Parkinson's disease have a family history of it. For the rest, the cause of Parkinson's is usually unknown.  Research suggests that a combination of genetic and environmental factors may cause Parkinson's disease.   Health Maintenance, Female Adopting a healthy lifestyle and getting preventive care are important in promoting health and wellness. Ask your health care provider about:  The right schedule for you to have regular tests and exams.  Things you can do on your own to prevent diseases and keep yourself healthy. What should I know about diet, weight, and exercise? Eat a healthy diet   Eat a diet that includes plenty of vegetables, fruits, low-fat dairy products, and lean protein.  Do not eat a lot of foods that are high in solid fats, added sugars, or sodium. Maintain a healthy weight Body mass index (BMI) is used to identify weight problems. It estimates body fat based on height and weight. Your health care provider can help determine your BMI and help you achieve or maintain a healthy weight. Get regular exercise Get regular exercise. This is one of the most important things you can do for your health. Most adults should:  Exercise for at least 150 minutes each week. The exercise should increase your heart rate and make you sweat (moderate-intensity exercise).  Do strengthening exercises at least twice a week. This is in addition to the moderate-intensity exercise.  Spend less time sitting. Even light physical activity can be beneficial. Watch cholesterol and blood lipids Have your blood tested for lipids and cholesterol at  62 years of age, then have this test every 5 years. Have your cholesterol levels checked more often if:  Your lipid or cholesterol levels are high.  You are older than 62 years of age.  You are at high risk for heart disease. What should I know about cancer screening? Depending on your health history and family history, you may need to have cancer screening at various ages. This may include screening for:  Breast cancer.  Cervical cancer.  Colorectal cancer.  Skin cancer.  Lung cancer. What should I know about heart disease, diabetes, and high blood pressure? Blood pressure and heart disease  High blood pressure causes heart disease and increases the risk of stroke. This is more likely to develop in people who have high blood pressure readings, are of African descent, or are overweight.  Have your blood pressure checked: ? Every 3-5 years if you are 62-62 years of age. ? Every year if you are 62 years old or older. Diabetes Have regular diabetes screenings. This checks your fasting blood sugar level. Have the screening done:  Once every three years after age 22 if you are at a normal weight and have a low risk for diabetes.  More often and at a younger age if you are overweight or have a high risk for diabetes. What should I know about preventing infection? Hepatitis B If you have a higher risk for hepatitis B, you should be screened for this virus. Talk with your health care provider to find out if you are at risk for hepatitis  B infection. Hepatitis C Testing is recommended for:  Everyone born from [birth year] through 1963.  Anyone with known risk factors for hepatitis C. Sexually transmitted infections (STIs)  Get screened for STIs, including gonorrhea and chlamydia, if: ? You are sexually active and are younger than 62 years of age. ? You are older than 62 years of age and your health care provider tells you that you are at risk for this type of infection. ? Your sexual  activity has changed since you were last screened, and you are at increased risk for chlamydia or gonorrhea. Ask your health care provider if you are at risk.  Ask your health care provider about whether you are at high risk for HIV. Your health care provider may recommend a prescription medicine to help prevent HIV infection. If you choose to take medicine to prevent HIV, you should first get tested for HIV. You should then be tested every 3 months for as long as you are taking the medicine. Pregnancy  If you are about to stop having your period (premenopausal) and you may become pregnant, seek counseling before you get pregnant.  Take 400 to 800 micrograms (mcg) of folic acid every day if you become pregnant.  Ask for birth control (contraception) if you want to prevent pregnancy. Osteoporosis and menopause Osteoporosis is a disease in which the bones lose minerals and strength with aging. This can result in bone fractures. If you are 41 years old or older, or if you are at risk for osteoporosis and fractures, ask your health care provider if you should:  Be screened for bone loss.  Take a calcium or vitamin D supplement to lower your risk of fractures.  Be given hormone replacement therapy (HRT) to treat symptoms of menopause. Follow these instructions at home: Lifestyle  Do not use any products that contain nicotine or tobacco, such as cigarettes, e-cigarettes, and chewing tobacco. If you need help quitting, ask your health care provider.  Do not use street drugs.  Do not share needles.  Ask your health care provider for help if you need support or information about quitting drugs. Alcohol use  Do not drink alcohol if: ? Your health care provider tells you not to drink. ? You are pregnant, may be pregnant, or are planning to become pregnant.  If you drink alcohol: ? Limit how much you use to 0-1 drink a day. ? Limit intake if you are breastfeeding.  Be aware of how much  alcohol is in your drink. In the [place] one drink equals one 12 oz bottle of beer (355 mL), one 5 oz glass of wine (148 mL), or one 1 oz glass of hard liquor (44 mL). General instructions  Schedule regular health, dental, and eye exams.  Stay current with your vaccines.  Tell your health care provider if: ? You often feel depressed. ? You have ever been abused or do not feel safe at home. Summary  Adopting a healthy lifestyle and getting preventive care are important in promoting health and wellness.  Follow your health care provider's instructions about healthy diet, exercising, and getting tested or screened for diseases.  Follow your health care provider's instructions on monitoring your cholesterol and blood pressure. This information is not intended to replace advice given to you by your health care provider. Make sure you discuss any questions you have with your health care provider. Document Revised: 08/06/2018 Document Reviewed: 08/06/2018 Elsevier Patient Education  2020 [employer].

## 2019-12-20 NOTE — Progress Notes (Addendum)
[name], [name], [name]  Date:  12/23/2019   Name:  Krista Schwartz   DOB:  [birth year]   MRN:  [id number]  PCP:  Darreld Mclean, MD    Chief Complaint: Annual Exam   History of Present Illness:  Krista Schwartz is a 64 y.o. very pleasant female patient who presents with the following:  Generally healthy woman here today for an annual physical  She is generally in good health but allergies bother her a lot She uses zyrtec and nasal sprays   Cologuard up-to-date Mammogram just completed Pap up-to-date per GYN COVID-19 series- done  Shingrix- offered to her today, she plans to do later  Blood work done 1 year ago; fasting today  Her gynecologist is Dr. Clovia Cuff- she just retired, I can take over her pap and HRT if she likes   She enjoys walking for exercise, no chest pain or shortness of breath  She has had a couple of episodes of postmenopausal bleeding, per patient these have been evaluated by GYN and were benign Never smoker  Patient Active Problem List   Diagnosis Date Noted  . History of skin cancer 06/14/2016    Past Medical History:  Diagnosis Date  . Cancer (HCC)    basal and squamous cell  . Chicken pox   . GERD (gastroesophageal reflux disease)   . H/O seasonal allergies   . Migraines     Past Surgical History:  Procedure Laterality Date  . BASAL CELL CARCINOMA EXCISION    . TUBAL LIGATION    . WISDOM TOOTH EXTRACTION      Social History   Tobacco Use  . Smoking status: Never Smoker  . Smokeless tobacco: Never Used  Substance Use Topics  . Alcohol use: No    Alcohol/week: 0.0 standard drinks  . Drug use: No    Family History  Problem Relation Age of Onset  . Parkinson's disease Mother   . Heart attack Father   . Cancer Paternal Grandfather        skin  . Cancer Maternal Grandmother        skin  . Hypertension Other    . Hyperlipidemia Other     No Known Allergies  Medication list has been reviewed and updated.  Current Outpatient Medications on File Prior to Visit  Medication Sig Dispense Refill  . cetirizine HCl (ZYRTEC) 1 MG/ML solution     . Cholecalciferol (VITAMIN D3) 5000 units TABS Take 1 tablet by mouth every other day.    . estradiol (VIVELLE-DOT) 0.05 MG/24HR patch PLACE 1 PATCH ON SKIN TWICE WEEKLY 24 patch 6  . fluticasone (FLONASE) 50 MCG/ACT nasal spray Place 2 sprays into both nostrils daily. 16 g 1  . meclizine (ANTIVERT) 25 MG tablet Take 1 tablet (25 mg total) by mouth as needed for dizziness. 30 tablet 1  . Multiple Vitamin (MULTIVITAMIN) tablet Take 1 tablet by mouth daily.    . progesterone (PROMETRIUM) 200 MG capsule Take 1 capsule (200 mg total) by mouth daily. 90 capsule 6   No current facility-administered medications on file prior to visit.    Review of Systems:  As per HPI- otherwise negative.   Physical Examination: Vitals:   12/23/19 0922  BP: 123/77  Pulse: (!) 54  Resp: 16  Temp: 98.3 F (36.8 C)  SpO2: 98%   Vitals:   12/23/19 [id number]  Weight: 150 lb (68 kg)  Height: 5\' 5"  (1.651 m)   Body mass index is 24.96 kg/m. Ideal Body Weight: Weight in (lb) to have BMI = 25: 149.9  GEN: no acute distress. Normal weight, looks well HEENT: Atraumatic, Normocephalic.   Bilateral TM wnl, oropharynx normal.  PEERL,EOMI.   Ears and Nose: No external deformity. CV: RRR, No M/G/R. No JVD. No thrill. No extra heart sounds. PULM: CTA B, no wheezes, crackles, rhonchi. No retractions. No resp. distress. No accessory muscle use. ABD: S, NT, ND, +BS. No rebound. No HSM. EXTR: No c/c/e PSYCH: Normally interactive. Conversant.    Assessment and Plan: Physical exam  Screening for deficiency anemia - Plan: CBC  Screening for diabetes mellitus - Plan: Comprehensive metabolic panel, Hemoglobin A1c  Screening for hyperlipidemia - Plan: Lipid panel  Screening for  thyroid disorder - Plan: TSH  Here today for routine physical. She is overall in good health, no concerns except for allergies. She is using over-the-counter medications for allergies as needed Routine labs pending as above, I will be in touch with patient reports Covid vaccine done Offered Shingrix, she plans to do at a later date Will plan further follow- up pending labs.  This visit occurred during the SARS-CoV-2 public health emergency.  Safety protocols were in place, including screening questions prior to the visit, additional usage of staff PPE, and extensive cleaning of exam room while observing appropriate contact time as indicated for disinfecting solutions.   Encouraged healthy diet, exercise, tobacco and alcohol avoidance Signed Lamar Blinks, MD  Addendum 4/29, received her labs as below  Message to patient Results for orders placed or performed in visit on 12/23/19  CBC  Result Value Ref Range   WBC 7.1 4.0 - 10.5 K/uL   RBC 4.67 3.87 - 5.11 Mil/uL   Platelets 287.0 150.0 - 400.0 K/uL   Hemoglobin 13.8 12.0 - 15.0 g/dL   HCT 41.4 36.0 - 46.0 %   MCV 88.7 78.0 - 100.0 fl   MCHC 33.4 30.0 - 36.0 g/dL   RDW 13.1 11.5 - 15.5 %  Comprehensive metabolic panel  Result Value Ref Range   Sodium 139 135 - 145 mEq/L   Potassium 4.3 3.5 - 5.1 mEq/L   Chloride 105 96 - 112 mEq/L   CO2 29 19 - 32 mEq/L   Glucose, Bld 90 70 - 99 mg/dL   BUN 11 6 - 23 mg/dL   Creatinine, Ser 0.98 0.40 - 1.20 mg/dL   Total Bilirubin 0.6 0.2 - 1.2 mg/dL   Alkaline Phosphatase 72 39 - 117 U/L   AST 13 0 - 37 U/L   ALT 9 0 - 35 U/L   Total Protein 6.3 6.0 - 8.3 g/dL   Albumin 4.1 3.5 - 5.2 g/dL   GFR 57.63 (L) >60.00 mL/min   Calcium 9.1 8.4 - 10.5 mg/dL  Hemoglobin A1c  Result Value Ref Range   Hgb A1c MFr Bld 5.1 4.6 - 6.5 %  Lipid panel  Result Value Ref Range   Cholesterol 199 0 - 200 mg/dL   Triglycerides 109.0 0.0 - 149.0 mg/dL   HDL 40.20 >39.00 mg/dL   VLDL 21.8 0.0 - 40.0  mg/dL   LDL Cholesterol 137 (H) 0 - 99 mg/dL   Total CHOL/HDL Ratio 5    NonHDL 158.35   TSH  Result Value Ref Range   TSH 1.34 0.35 - 4.50 uIU/mL   Labs

## 2019-12-23 ENCOUNTER — Ambulatory Visit (INDEPENDENT_AMBULATORY_CARE_PROVIDER_SITE_OTHER): Payer: Commercial Managed Care - PPO | Admitting: Family Medicine

## 2019-12-23 ENCOUNTER — Other Ambulatory Visit: Payer: Self-pay

## 2019-12-23 ENCOUNTER — Encounter: Payer: Self-pay | Admitting: Family Medicine

## 2019-12-23 VITALS — BP 123/77 | HR 54 | Temp 98.3°F | Resp 16 | Ht 65.0 in | Wt 150.0 lb

## 2019-12-23 DIAGNOSIS — Z131 Encounter for screening for diabetes mellitus: Secondary | ICD-10-CM

## 2019-12-23 DIAGNOSIS — Z1329 Encounter for screening for other suspected endocrine disorder: Secondary | ICD-10-CM

## 2019-12-23 DIAGNOSIS — Z130 Encounter for screening for diseases of the blood and blood-forming organs and certain disorders involving the immune mechanism: Secondary | ICD-10-CM

## 2019-12-23 DIAGNOSIS — Z13220 Encounter for screening for lipoid disorders: Secondary | ICD-10-CM | POA: Diagnosis not present

## 2019-12-23 DIAGNOSIS — Z0000 Encounter for general adult medical examination without abnormal findings: Secondary | ICD-10-CM

## 2019-12-23 LAB — COMPREHENSIVE METABOLIC PANEL
ALT: 9 U/L (ref 0–35)
AST: 13 U/L (ref 0–37)
Albumin: 4.1 g/dL (ref 3.5–5.2)
Alkaline Phosphatase: 72 U/L (ref 39–117)
BUN: 11 mg/dL (ref 6–23)
CO2: 29 mEq/L (ref 19–32)
Calcium: 9.1 mg/dL (ref 8.4–10.5)
Chloride: 105 mEq/L (ref 96–112)
Creatinine, Ser: 0.98 mg/dL (ref 0.40–1.20)
GFR: 57.63 mL/min — ABNORMAL LOW (ref >60.00)
Glucose, Bld: 90 mg/dL (ref 70–99)
Potassium: 4.3 mEq/L (ref 3.5–5.1)
Sodium: 139 mEq/L (ref 135–145)
Total Bilirubin: 0.6 mg/dL (ref 0.2–1.2)
Total Protein: 6.3 g/dL (ref 6.0–8.3)

## 2019-12-23 LAB — LIPID PANEL
Cholesterol: 199 mg/dL (ref 0–200)
HDL: 40.2 mg/dL (ref >39.00)
LDL Cholesterol: 137 mg/dL — ABNORMAL HIGH (ref 0–99)
NonHDL: 158.35
Total CHOL/HDL Ratio: 5
Triglycerides: 109 mg/dL (ref 0.0–149.0)
VLDL: 21.8 mg/dL (ref 0.0–40.0)

## 2019-12-23 LAB — HEMOGLOBIN A1C: Hgb A1c MFr Bld: 5.1 % (ref 4.6–6.5)

## 2019-12-23 LAB — TSH: TSH: 1.34 u[IU]/mL (ref 0.35–4.50)

## 2019-12-24 ENCOUNTER — Encounter: Payer: Self-pay | Admitting: Family Medicine

## 2019-12-24 LAB — CBC
HCT: 41.4 % (ref 36.0–46.0)
Hemoglobin: 13.8 g/dL (ref 12.0–15.0)
MCHC: 33.4 g/dL (ref 30.0–36.0)
MCV: 88.7 fl (ref 78.0–100.0)
Platelets: 287 10*3/uL (ref 150.0–400.0)
RBC: 4.67 Mil/uL (ref 3.87–5.11)
RDW: 13.1 % (ref 11.5–15.5)
WBC: 7.1 10*3/uL (ref 4.0–10.5)

## 2020-02-15 ENCOUNTER — Other Ambulatory Visit: Payer: Self-pay | Admitting: Obstetrics & Gynecology

## 2020-02-15 ENCOUNTER — Ambulatory Visit (INDEPENDENT_AMBULATORY_CARE_PROVIDER_SITE_OTHER): Payer: Commercial Managed Care - PPO | Admitting: Obstetrics & Gynecology

## 2020-02-15 ENCOUNTER — Other Ambulatory Visit: Payer: Self-pay

## 2020-02-15 ENCOUNTER — Encounter: Payer: Self-pay | Admitting: Obstetrics & Gynecology

## 2020-02-15 VITALS — BP 129/83 | HR 58 | Resp 16 | Ht 65.0 in | Wt 153.0 lb

## 2020-02-15 DIAGNOSIS — N950 Postmenopausal bleeding: Secondary | ICD-10-CM

## 2020-02-15 NOTE — Progress Notes (Signed)
   Subjective:    Patient ID: Krista Schwartz, female    DOB: 22-Jan-1956, 64 y.o.   MRN: [id number]  HPI  Pt presents for 5 months of spotting on HRT.  Pt had this back in 2018 and had neg endometrial biopsy and TVUS with 6 mm stripe.  Bleeding stopped for 3 years and restarted early 2021.  Pt unsure of how long she has been on HRT.    Review of Systems  Constitutional: Negative.   Respiratory: Negative.   Cardiovascular: Negative.   Gastrointestinal: Negative.   Genitourinary: Positive for vaginal bleeding.       Objective:   Physical Exam Vitals reviewed.  Constitutional:      General: She is not in acute distress.    Appearance: She is well-developed.  HENT:     Head: Normocephalic and atraumatic.  Eyes:     Conjunctiva/sclera: Conjunctivae normal.  Cardiovascular:     Rate and Rhythm: Normal rate.  Pulmonary:     Effort: Pulmonary effort is normal.  Abdominal:     General: Abdomen is flat.     Palpations: Abdomen is soft.     Tenderness: There is no abdominal tenderness. There is no guarding.  Genitourinary:    Comments: Tanner V Vulva:  No lesion Vagina:  Pink, no lesions, no discharge, no blood Cervix:  No CMT Uterus:  Non tender, mobile Right adnexa--non tender, no mass Left adnexa--non tender, no mass  Skin:    General: Skin is warm and dry.  Neurological:     Mental Status: She is alert and oriented to person, place, and time.        Assessment & Plan:  64 yo female with PMB on HRT  Endometrial biopsy today If continues, will get TVUS  Discussed risks of HRT and breast cancer.  Pt is going to check with her prior gyn and see how long she has been on HRT.  ENDOMETRIAL BIOPSY     The indications for endometrial biopsy were reviewed.   Risks of the biopsy including cramping, bleeding, infection, uterine perforation, inadequate specimen and need for additional procedures  were discussed. The patient states she understands and agrees to undergo  procedure today. Consent was signed. Time out was performed. Urine HCG was negative. A sterile speculum was placed in the patient's vagina and the cervix was prepped with Betadine. A single-toothed tenaculum was placed on the anterior lip of the cervix to stabilize it. The 3 mm pipelle was introduced into the endometrial cavity without difficulty to a depth of 8.5 cm, and a scant amount of tissue was obtained and sent to pathology. The instruments were removed from the patient's vagina. Minimal bleeding from the cervix was noted. The patient tolerated the procedure well. Routine post-procedure instructions were given to the patient. The patient will follow up to review the results and for further management.

## 2020-03-14 ENCOUNTER — Telehealth: Payer: Self-pay | Admitting: *Deleted

## 2020-03-14 NOTE — Telephone Encounter (Signed)
Patient left a message that she has requested medical records to be sent to [place].

## 2020-05-23 ENCOUNTER — Encounter: Payer: Self-pay | Admitting: Family Medicine

## 2020-05-24 ENCOUNTER — Telehealth: Payer: Self-pay

## 2020-05-24 NOTE — Telephone Encounter (Signed)
-----   Message from Darreld Mclean, MD sent at 05/24/2020  6:37 AM EDT ----- Krista Schwartz, this patient just contacted me about heart palpitations Can we add her at the end of the day on Wednesday, I do not think we have anyone at 3 PM  Thank you!

## 2020-05-24 NOTE — Telephone Encounter (Signed)
Please call her and let her know.

## 2020-05-24 NOTE — Telephone Encounter (Signed)
Left message to return call. Sent my chart message

## 2020-05-24 NOTE — Telephone Encounter (Signed)
I have placed her on the schedule for 3:00 Wednesday. Do I need to call her or is she aware to come in?

## 2020-05-25 ENCOUNTER — Ambulatory Visit (INDEPENDENT_AMBULATORY_CARE_PROVIDER_SITE_OTHER): Payer: Commercial Managed Care - PPO | Admitting: Family Medicine

## 2020-05-25 ENCOUNTER — Other Ambulatory Visit: Payer: Self-pay

## 2020-05-25 VITALS — BP 108/64 | HR 55 | Resp 16 | Ht 65.0 in | Wt 152.0 lb

## 2020-05-25 DIAGNOSIS — Z23 Encounter for immunization: Secondary | ICD-10-CM

## 2020-05-25 DIAGNOSIS — R002 Palpitations: Secondary | ICD-10-CM

## 2020-05-25 DIAGNOSIS — I493 Ventricular premature depolarization: Secondary | ICD-10-CM

## 2020-05-25 NOTE — Progress Notes (Signed)
[place] at [facility] [address] [place], [place] [address] [phone]  Date:  05/25/2020   Name:  Krista Schwartz   DOB:  05/21/1956   MRN:  [id number]  PCP:  Darreld Mclean, MD    Chief Complaint: Palpitations (feels like its skipping a beat,started 1-2 weeks, no chest pain)   History of Present Illness:  Krista Schwartz is a 64 y.o. very pleasant female patient who presents with the following:  Generally healthy woman with history of skin cancer, allergies here today for concern of palpitations She contacted me recently with the following message: I have noticed my heart beating out of rhythm off and on for a week or so. Have thought maybe it was just stress. Had my nurse practitioner daughter listen tonight with the onset and she said my heart was skipping about every 4th beat. I asked for an appointment for Wednesday with you.  She will note intermittent palpitations over the last 2 weeks or so.  Generally no CP or pressure. Will occur and then stop for a while. No pattern that she can determine Most recent labs in April, thyroid, CBC, c-Met normal at that time EKG on chart from 2019 Patient had an echo also in 2019 due to family history of bicuspid aortic valve.  Her valve was normal No change in her caff intake or general activities She has been a bit more stressed recently- her work has gotten more stressed   She has no difficulty with climbing 3 flights of stairs - she dos not daily at her workplace Good general exercise tolerance  She did have an episode of self-limited chest discomfort last week which seemed to be due to GERD- she was burping quite a bit when this happened.  It has not come back  Patient Active Problem List   Diagnosis Date Noted  . History of skin cancer 06/14/2016  . Postmenopausal hormone replacement therapy 06/15/2013  . Breast lipoma 06/15/2013    Past Medical History:   Diagnosis Date  . Cancer (HCC)    basal and squamous cell  . Chicken pox   . GERD (gastroesophageal reflux disease)   . H/O seasonal allergies   . Migraines     Past Surgical History:  Procedure Laterality Date  . BASAL CELL CARCINOMA EXCISION    . TUBAL LIGATION    . WISDOM TOOTH EXTRACTION      Social History   Tobacco Use  . Smoking status: Never Smoker  . Smokeless tobacco: Never Used  Vaping Use  . Vaping Use: Never used  Substance Use Topics  . Alcohol use: No    Alcohol/week: 0.0 standard drinks  . Drug use: No    Family History  Problem Relation Age of Onset  . Parkinson's disease Mother   . Heart attack Father   . Cancer Paternal Grandfather        skin  . Cancer Maternal Grandmother        skin  . Hypertension Other   . Hyperlipidemia Other     No Known Allergies  Medication list has been reviewed and updated.  Current Outpatient Medications on File Prior to Visit  Medication Sig Dispense Refill  . cetirizine HCl (ZYRTEC) 1 MG/ML solution     . Cholecalciferol (VITAMIN D3) 5000 units TABS Take 1 tablet by mouth every other day.    . estradiol (VIVELLE-DOT) 0.05 MG/24HR patch PLACE 1 PATCH ON  SKIN TWICE WEEKLY 24 patch 6  . fexofenadine (ALLEGRA) 60 MG tablet Take 60 mg by mouth 2 (two) times daily.    . meclizine (ANTIVERT) 25 MG tablet Take 1 tablet (25 mg total) by mouth as needed for dizziness. 30 tablet 1  . Multiple Vitamin (MULTIVITAMIN) tablet Take 1 tablet by mouth daily.    . progesterone (PROMETRIUM) 200 MG capsule Take 1 capsule (200 mg total) by mouth daily. 90 capsule 6   No current facility-administered medications on file prior to visit.    Review of Systems:  As per HPI- otherwise negative.   Physical Examination: Vitals:   05/25/20 1505  BP: 108/64  Pulse: (!) 55  Resp: 16  SpO2: 98%   Vitals:   05/25/20 1505  Weight: 152 lb (68.9 kg)  Height: 5' 5"  (1.651 m)   Body mass index is 25.29 kg/m. Ideal Body Weight:  Weight in (lb) to have BMI = 25: 149.9  GEN: no acute distress.  Normal weight, looks well  HEENT: Atraumatic, Normocephalic.  Ears and Nose: No external deformity. CV: RRR, No M/G/R. No JVD. No thrill. No extra heart sounds. PULM: CTA B, no wheezes, crackles, rhonchi. No retractions. No resp. distress. No accessory muscle use. ABD: S, NT, ND, +BS. No rebound. No HSM. EXTR: No c/c/e PSYCH: Normally interactive. Conversant.  EKG: 2 tracings taken.  One had one PVC, the other had 2.  Pt felt these occur and confirms these are the palpitations she has noticed  Compared with EKG January 2019, sinus bradycardia Assessment and Plan: PVC (premature ventricular contraction) - Plan: TSH  Palpitations - Plan: EKG 12-Lead  Needs flu shot - Plan: Flu Vaccine QUAD 6+ mos PF IM (Fluarix Quad PF)  Pt here today with concern of palpitations- c/w PVCs.  Discussed with pt- these are generally benign.  We will check her TSH Offered to have her see cardiology- for now she declines She will let me know if this is getting worse or not clearing up Trial of OTC PPI for about 2 weeks Flu shot given today  This visit occurred during the SARS-CoV-2 public health emergency.  Safety protocols were in place, including screening questions prior to the visit, additional usage of staff PPE, and extensive cleaning of exam room while observing appropriate contact time as indicated for disinfecting solutions.    Signed Lamar Blinks, MD

## 2020-05-25 NOTE — Patient Instructions (Signed)
Good to see you again today!  You got your flu shot today You are having PVCs- these are early beats coming from the bottom part of the heart.  These are benign- but can be disconcerting! We will check your thyroid and make sure this is normal  Let me know if the palpitations continue to bother you or if they are getting worse!   You might try an OTC anti-acid medication such as prilosec OTC otc for 2 weeks; this may actually help with your PVCs

## 2020-05-26 ENCOUNTER — Encounter: Payer: Self-pay | Admitting: Family Medicine

## 2020-05-26 DIAGNOSIS — I493 Ventricular premature depolarization: Secondary | ICD-10-CM

## 2020-05-26 DIAGNOSIS — R002 Palpitations: Secondary | ICD-10-CM

## 2020-05-26 LAB — TSH: TSH: 1.09 mIU/L (ref 0.40–4.50)

## 2020-06-10 ENCOUNTER — Other Ambulatory Visit: Payer: Self-pay | Admitting: Obstetrics & Gynecology

## 2020-06-10 DIAGNOSIS — N950 Postmenopausal bleeding: Secondary | ICD-10-CM

## 2020-06-10 NOTE — Progress Notes (Signed)
PMB on HRT.  Nml Endometrial Bx.  [place] ordered.

## 2020-06-17 ENCOUNTER — Ambulatory Visit (HOSPITAL_BASED_OUTPATIENT_CLINIC_OR_DEPARTMENT_OTHER)
Admission: RE | Admit: 2020-06-17 | Discharge: 2020-06-17 | Disposition: A | Discharge status: Home / Self Care | Payer: Commercial Managed Care - PPO | Source: Ambulatory Visit | Attending: Obstetrics & Gynecology | Admitting: Obstetrics & Gynecology

## 2020-06-17 ENCOUNTER — Other Ambulatory Visit: Payer: Self-pay

## 2020-06-17 DIAGNOSIS — N950 Postmenopausal bleeding: Secondary | ICD-10-CM | POA: Diagnosis present

## 2020-06-17 NOTE — Addendum Note (Signed)
Addended by: Lamar Blinks C on: 06/17/2020 12:12 PM   Modules accepted: Orders

## 2020-06-22 ENCOUNTER — Encounter: Payer: Self-pay | Admitting: Obstetrics & Gynecology

## 2020-06-22 ENCOUNTER — Other Ambulatory Visit: Payer: Self-pay

## 2020-06-22 ENCOUNTER — Other Ambulatory Visit: Payer: Self-pay | Admitting: Obstetrics & Gynecology

## 2020-06-22 ENCOUNTER — Ambulatory Visit (INDEPENDENT_AMBULATORY_CARE_PROVIDER_SITE_OTHER): Payer: Commercial Managed Care - PPO | Admitting: Obstetrics & Gynecology

## 2020-06-22 VITALS — BP 131/77 | HR 62 | Ht 65.0 in | Wt 155.0 lb

## 2020-06-22 DIAGNOSIS — N858 Other specified noninflammatory disorders of uterus: Secondary | ICD-10-CM

## 2020-06-22 MED ORDER — MEDROXYPROGESTERONE ACETATE 5 MG PO TABS: 5.0000 mg | ORAL_TABLET | Freq: Every day | ORAL | 6 refills | Status: DC

## 2020-06-22 MED FILL — MEDROXYPROGESTERONE 5 MG TA: 5 | 15 days supply | Qty: 15 | Fill #0

## 2020-06-22 NOTE — Progress Notes (Addendum)
History:  64 y.o. G3P3003 here today for eval of PMPB. Pt has been on ERT since 2013. She has had a few episodes during that time when she was eval for bleeding. The workup eahc time was neg for endo ca. She was advised on the most recent occ that she should stop the HRT but, pt reports that she has severe hot flushes and mood changes off the meds and is very hesitant to stop it. She is here for a second opinion. She denies risk factors to endometrial ca.  She is not having bleeding at present.      The following portions of the patient's history were reviewed and updated as appropriate: allergies, current medications, past family history, past medical history, past social history, past surgical history and problem list.  Review of Systems:  Pertinent items are noted in HPI.    Objective:  Physical Exam Blood pressure 131/77, pulse 62, height 5\' 5"  (1.651 m), weight 155 lb (70.3 kg).  CONSTITUTIONAL: Well-developed, well-nourished female in no acute distress.  HENT:  Normocephalic, atraumatic EYES: Conjunctivae and EOM are normal. No scleral icterus.  NECK: Normal range of motion SKIN: Skin is warm and dry. No rash noted. Not diaphoretic.No pallor. [place]: Alert and oriented to person, place, and time. Normal coordination.  Pelvic: not done  Labs and Imaging  02/06/2017 CLINICAL DATA:  Postmenopausal bleeding  EXAM: TRANSABDOMINAL AND TRANSVAGINAL ULTRASOUND OF PELVIS  TECHNIQUE: Both transabdominal and transvaginal ultrasound examinations of the pelvis were performed. Transabdominal technique was performed for global imaging of the pelvis including uterus, ovaries, adnexal regions, and pelvic cul-de-sac. It was necessary to proceed with endovaginal exam following the transabdominal exam to visualize the endometrium and bilateral ovaries.  COMPARISON:  None  FINDINGS: Uterus  Measurements: 9.8 x 5.0 x 5.7 cm. No fibroids or other  mass visualized.  Endometrium  Thickness: 6 mm.  No focal abnormality visualized.  Right ovary  Measurements: 2.1 x 1.3 x 1.9 cm. Normal appearance/no adnexal mass.  Left ovary  Measurements: 2.0 x 1.1 x 1.3 cm. Normal appearance/no adnexal mass.  Other findings  No abnormal free fluid.  IMPRESSION: Endometrial complex measures 6 mm.  In the setting of post-menopausal bleeding, endometrial sampling is indicated to exclude carcinoma. If results are benign, sonohysterogram should be considered for focal lesion work-up.  (Ref: Radiological Reasoning: Algorithmic Workup of Abnormal Vaginal Bleeding with Endovaginal Sonography and Sonohysterography. AJR 2008; 109:N23-55)  02/19/2017 Diagnosis Endometrium, biopsy - ATROPHIC ENDOMETRIUM WITH BREAKDOWN AND METAPLASTIC CHANGES. - NO HYPERPLASIA, ATYPIA OR MALIGNANCY IDENTIFIED.  02/15/2020 Endometrium, biopsy - ENDOMETRIUM WITH STROMAL DECIDUALIZATION AND GLANDULAR ATROPHY. - FRAGMENTS OF ATROPHIC ENDOMETRIAL GLANDULAR MUCOSA. - NO HYPERPLASIA OR MALIGNANCY IDENTIFIED. - INCIDENTAL FRAGMENTS OF BENIGN ENDOCERVICAL GLANDULAR MUCOSA.  US PELVIC COMPLETE WITH TRANSVAGINAL  Result Date: 06/17/2020 CLINICAL DATA:  Postmenopausal bleeding EXAM: TRANSABDOMINAL AND TRANSVAGINAL ULTRASOUND OF PELVIS TECHNIQUE: Both transabdominal and transvaginal ultrasound examinations of the pelvis were performed. Transabdominal technique was performed for global imaging of the pelvis including uterus, ovaries, adnexal regions, and pelvic cul-de-sac. It was necessary to proceed with endovaginal exam following the transabdominal exam to visualize the endometrium and left adnexa. COMPARISON:  None FINDINGS: Uterus Measurements: 10.6 x 4.9 x 5.6 cm = volume: 150 mL. No fibroids or other mass visualized. Endometrium Thickness: 4 mm.  No focal abnormality visualized. Right ovary Measurements: 3.3 x 2.3 x 2.4 cm = volume: 10 mL. Normal appearance/no  adnexal mass. Left ovary Not visualized.  No adnexal masses are seen. Other  findings No abnormal free fluid. IMPRESSION: Nonvisualization of the left ovary. Otherwise unremarkable pelvic sonogram. In the setting of post-menopausal bleeding, this is consistent with a benign etiology such as endometrial atrophy. If bleeding remains unresponsive to hormonal or medical therapy, sonohysterogram should be considered for focal lesion work-up. (Ref: Radiological Reasoning: Algorithmic Workup of Abnormal Vaginal Bleeding with Endovaginal Sonography and Sonohysterography. AJR 2008; 842:J03-12) in Electronically Signed   By: Fidela Salisbury MD   On: 06/17/2020 12:56    Assessment & Plan:  PMPB- chart fully reviewed. I suspect atrophic endometrium from review of each of the biopsies and the [place] reports. Pt has been on daily EES plus progestins since 2013. I have reviewed her tx options including: limiting the amount of the progestins to 5 days per month,  Office hysteroscopy with limited estrogen only treatment if the study confirms no lesion or hysteroscopy with endometrial ablation in the OR.  After options reviewed and questions answered, pt opts for limiting the progestins 5 days per month. I have reviewed the mechanisms of action of progestins and EES.   Progestins only on days 1-5 of each month. Pt has Prometrium on hand so she can use that until it is completed then will change  to Provera 5mg  daily on days 1-5 of the month.  F/u in 3 months or sooner prn  If bleeding returns during that time, will proceed to hysteroscopy in the OR.   Total face-to-face time with patient was 25 min.  Greater than 50% was spent in counseling and coordination of care with the patient.   Avery Klingbeil L. Harraway-Smith, M.D., [place]  08/29/2020 Update from pt received via Epic "my mother's mother had endometrial cancer and my mother's sister also had endometrial cancer. They had both taken Premarin".

## 2020-06-29 ENCOUNTER — Encounter: Payer: Self-pay | Admitting: *Deleted

## 2020-06-29 ENCOUNTER — Ambulatory Visit: Payer: Commercial Managed Care - PPO | Admitting: Cardiology

## 2020-06-29 ENCOUNTER — Encounter: Payer: Self-pay | Admitting: Cardiology

## 2020-06-29 ENCOUNTER — Other Ambulatory Visit: Payer: Self-pay

## 2020-06-29 VITALS — BP 118/72 | HR 63 | Ht 65.0 in | Wt 154.0 lb

## 2020-06-29 DIAGNOSIS — I493 Ventricular premature depolarization: Secondary | ICD-10-CM

## 2020-06-29 DIAGNOSIS — Z7189 Other specified counseling: Secondary | ICD-10-CM

## 2020-06-29 DIAGNOSIS — R002 Palpitations: Secondary | ICD-10-CM

## 2020-06-29 DIAGNOSIS — Z8249 Family history of ischemic heart disease and other diseases of the circulatory system: Secondary | ICD-10-CM

## 2020-06-29 NOTE — Progress Notes (Signed)
Cardiology Office Note:    Date:  06/29/2020   ID:  Krista Schwartz, DOB Nov 27, 1955, MRN [id number]  PCP:  Darreld Mclean, MD  Cardiologist:  Buford Dresser, MD  Referring MD: Darreld Mclean, MD   CC: new patient consultation for palpitations  History of Present Illness:    Krista Schwartz is a 64 y.o. female without significant past medical history who is seen as a new consult at the request of Copland, Gay Filler, MD for the evaluation and management of palpitations.  Note from 05/25/20 with Dr. Lorelei Pont reviewed. Noted to have about 1-2 weeks of skipped beats and palpitations. Referred to cardiology for further evaluation. ECG done 05/25/20 showed sinus rhythm with a PVC.  Today: Main concern is related to palpitations, below.  Tachycardia/palpitations: -Initial onset: early September. Had some higher stress with her job (now the only person in the office) -Frequency/Duration: has lessened, but at least once/day for several minutes. Longest has been several hours -Associated symptoms: mild tightness in the chest but no shortness of breath, nausea, diaphoresis -Aggravating/alleviating factors: better with lying down -Syncope/near syncope: none -Prior cardiac history: told she might have a murmur, not evalauted -Prior ECG: NSR with PVC -Prior workup: echo in 2019 for screening for bicuspid aortic valve -Prior treatment: none -Possible medication interactions: -Caffeine: at least one big chik-fil-a tea per day -Alcohol: rare -Tobacco: never -OTC supplements:only as listed. -Comorbidities: denies ASCVD, hypertension, diabetes, kidney disease. Told LDL was borderline. -Exercise level: largely sedentary, but no limitations. Climbs stairs daily at home and work. -Diet: husband has alpha-gal, so very little red meat. Eats a lot of fish and chicken. -Labs: TSH, kidney function/electrolytes, CBC reviewed. -Cardiac ROS: no chest pain, no shortness of breath, no  PND, no orthopnea, no LE edema. -Family history: Father had an MI several years ago, was in his 28s. Brother with bicuspid aortic valve, she had screening for this in 2019 (normal). No premature CAD, no sudden cardiac death.   Past Medical History:  Diagnosis Date  . Cancer (HCC)    basal and squamous cell  . Chicken pox   . GERD (gastroesophageal reflux disease)   . H/O seasonal allergies   . Migraines     Past Surgical History:  Procedure Laterality Date  . BASAL CELL CARCINOMA EXCISION    . TUBAL LIGATION    . WISDOM TOOTH EXTRACTION      Current Medications: Current Outpatient Medications on File Prior to Visit  Medication Sig  . cetirizine HCl (ZYRTEC) 1 MG/ML solution   . Cholecalciferol (VITAMIN D3) 5000 units TABS Take 1 tablet by mouth every other day.  . estradiol (VIVELLE-DOT) 0.05 MG/24HR patch PLACE 1 PATCH ON SKIN TWICE WEEKLY  . fexofenadine (ALLEGRA) 60 MG tablet Take 60 mg by mouth 2 (two) times daily.  . meclizine (ANTIVERT) 25 MG tablet Take 1 tablet (25 mg total) by mouth as needed for dizziness.  . medroxyPROGESTERone (PROVERA) 5 MG tablet Take 1 tablet (5 mg total) by mouth daily.  . Multiple Vitamin (MULTIVITAMIN) tablet Take 1 tablet by mouth daily.   No current facility-administered medications on file prior to visit.     Allergies:   Patient has no known allergies.   Social History   Tobacco Use  . Smoking status: Never Smoker  . Smokeless tobacco: Never Used  Vaping Use  . Vaping Use: Never used  Substance Use Topics  . Alcohol use: No    Alcohol/week: 0.0 standard drinks  . Drug  use: No    Family History: family history includes Cancer in her maternal grandmother and paternal grandfather; Heart attack in her father; Hyperlipidemia in an other family member; Hypertension in an other family member; Parkinson's disease in her mother.  ROS:   Please see the history of present illness.  Additional pertinent ROS: Constitutional: Negative for  chills, fever, night sweats, unintentional weight loss  HENT: Negative for ear pain and hearing loss.   Eyes: Negative for loss of vision and eye pain.  Respiratory: Negative for cough, sputum, wheezing.   Cardiovascular: See HPI. Gastrointestinal: Negative for abdominal pain, melena, and hematochezia.  Genitourinary: Negative for dysuria and hematuria.  Musculoskeletal: Negative for falls and myalgias.  Skin: Negative for itching and rash.  Neurological: Negative for focal weakness, focal sensory changes and loss of consciousness.  Endo/Heme/Allergies: Does not bruise/bleed easily.     EKGs/Labs/Other Studies Reviewed:    The following studies were reviewed today: Echo 09/26/2017 - Left ventricle: The cavity size was normal. Systolic function was  normal. The estimated ejection fraction was in the range of 60%  to 65%. Wall motion was normal; there were no regional wall  motion abnormalities. Doppler parameters are consistent with  abnormal left ventricular relaxation (grade 1 diastolic  dysfunction).  - Aortic valve: Trileaflet; normal thickness leaflets. There was no  regurgitation.  - Right ventricle: The cavity size was normal. Wall thickness was  normal. Systolic function was normal.  - Right atrium: The atrium was normal in size.  - Pulmonary arteries: Systolic pressure was within the normal  range.  - Inferior vena cava: The vessel was normal in size.  - Pericardium, extracardiac: There was no pericardial effusion.   Impressions:   - No evidence for bicuspid aortic valve.   EKG:  EKG is personally reviewed.  The ekg ordered today demonstrates normal sinus rhythm at 63 bpm  Recent Labs: 12/23/2019: ALT 9; BUN 11; Creatinine, Ser 0.98; Hemoglobin 13.8; Platelets 287.0; Potassium 4.3; Sodium 139 05/25/2020: TSH 1.09  Recent Lipid Panel    Component Value Date/Time   CHOL 199 12/23/2019 0945   TRIG 109.0 12/23/2019 0945   HDL 40.20 12/23/2019 0945    CHOLHDL 5 12/23/2019 0945   VLDL 21.8 12/23/2019 0945   LDLCALC 137 (H) 12/23/2019 0945    Physical Exam:    VS:  BP 118/72   Pulse 63   Ht 5\' 5"  (1.651 m)   Wt 154 lb (69.9 kg)   SpO2 99%   BMI 25.63 kg/m     Wt Readings from Last 3 Encounters:  06/29/20 154 lb (69.9 kg)  06/22/20 155 lb (70.3 kg)  05/25/20 152 lb (68.9 kg)    GEN: Well nourished, well developed in no acute distress HEENT: Normal, moist mucous membranes NECK: No JVD CARDIAC: regular rhythm, normal S1 and S2, no rubs or gallops. No murmurs. VASCULAR: Radial and DP pulses 2+ bilaterally. No carotid bruits RESPIRATORY:  Clear to auscultation without rales, wheezing or rhonchi  ABDOMEN: Soft, non-tender, non-distended MUSCULOSKELETAL:  Ambulates independently SKIN: Warm and dry, no edema NEUROLOGIC:  Alert and oriented x 3. No focal neuro deficits noted. PSYCHIATRIC:  Normal affect    ASSESSMENT:    1. Heart palpitations   2. PVC (premature ventricular contraction)   3. Family history of heart disease   4. Cardiac risk counseling   5. Counseling on health promotion and disease prevention    PLAN:    Palpitations PVC on prior ECG -we discussed the potential  causes of fast heart rates and palpitations today. Reviewed the normal electrical system of the heart. Reviewed the role of the sinus node. Reviewed the balance between resting (vagal) tone and fight or flight nervous system input. Reviewed how exercise improves vagal tone and lowers resting heart rate. Reviewed that sinus tachycardia, or elevated sinus rate, is usually secondary to something else in the body. This can include pain, stress, infection, anxiety, hormone imbalance, low blood counts, etc. Discussed that we do not typically treat sinus tachycardia by itself, and instead the focus is on finding what is driving the heart rate and treating that. We discussed that there can be other rhythm issues, from either the top or bottom of the heart, that are  abnormal rhythms. Discussed how we evaluate for these. -will get 2 week Zio for further evaluation -prior echo reviewed  Cardiac risk counseling and prevention recommendations: family history of heart disease noted -recommend heart healthy/Mediterranean diet, with whole grains, fruits, vegetable, fish, lean meats, nuts, and olive oil. Limit salt. -recommend moderate walking, 3-5 times/week for 30-50 minutes each session. Aim for at least 150 minutes.week. Goal should be pace of 3 miles/hours, or walking 1.5 miles in 30 minutes -recommend avoidance of tobacco products. Avoid excess alcohol. -Additional risk factor control:  -Diabetes risk: A1c is 5.1  -Lipids: reviewed, LDL not ideal but low ASCVD risk, no current indication for statin  -Blood pressure control: well controlled on no medications  -Weight: at goal, BMI 25 -ASCVD risk score: The 10-year ASCVD risk score Mikey Bussing DC Brooke Bonito., et al., 2013) is: 3.8%   Values used to calculate the score:     Age: 44 years     Sex: Female     Is Non-Hispanic African American: No     Diabetic: No     Tobacco smoker: No     Systolic Blood Pressure: 099 mmHg     Is BP treated: No     HDL Cholesterol: 40.2 mg/dL     Total Cholesterol: 199 mg/dL    Plan for follow up: if monitor unremarkable, can follow up as needed  Buford Dresser, MD, PhD [place]  [facility]    Medication Adjustments/Labs and Tests Ordered: Current medicines are reviewed at length with the patient today.  Concerns regarding medicines are outlined above.  Orders Placed This Encounter  Procedures  . LONG TERM MONITOR (3-14 DAYS)  . EKG 12-Lead   No orders of the defined types were placed in this encounter.   Patient Instructions  Medication Instructions:  Your Physician recommend you continue on your current medication as directed.    *If you need a refill on your cardiac medications before your next appointment, please call your pharmacy*   Lab  Work: None  Testing/Procedures: Our physician has recommended that you wear an [place] monitor. The Zio patch cardiac monitor continuously records heart rhythm data for up to 14 days, this is for patients being evaluated for multiple types heart rhythms. For the first 24 hours post application, please avoid getting the Zio monitor wet in the shower or by excessive sweating during exercise. After that, feel free to carry on with regular activities. Keep soaps and lotions away from the ZIO XT Patch.   Someone from our office will call to verify address and mail monitor.     Follow-Up: At [facility], you and your health needs are our priority.  As part of our continuing mission to provide you with exceptional heart care,  we have created designated Provider Care Teams.  These Care Teams include your primary Cardiologist (physician) and Advanced Practice Providers (APPs -  Physician Assistants and Nurse Practitioners) who all work together to provide you with the care you need, when you need it.  We recommend signing up for the patient portal called "MyChart".  Sign up information is provided on this After Visit Summary.  MyChart is used to connect with patients for Virtual Visits (Telemedicine).  Patients are able to view lab/test results, encounter notes, upcoming appointments, etc.  Non-urgent messages can be sent to your provider as well.   To learn more about what you can do with MyChart, go to [email or web address].    Your next appointment:   As needed  The format for your next appointment:   In Person  Provider:   Buford Dresser, MD  [place] Instructions   Your physician has requested you wear your ZIO patch monitor__14_____days.   This is a single patch monitor.  [employer] supplies one patch monitor per enrollment.  Additional stickers are not available.   Please do not apply patch if you will be having a Nuclear Stress Test, Echocardiogram,  Cardiac CT, MRI, or Chest Xray during the time frame you would be wearing the monitor. The patch cannot be worn during these tests.  You cannot remove and re-apply the ZIO XT patch monitor.   Your ZIO patch monitor will be sent USPS Priority mail from [facility] directly to your home address. The monitor may also be mailed to a PO BOX if home delivery is not available.   It may take 3-5 days to receive your monitor after you have been enrolled.   Once you have received you monitor, please review enclosed instructions.  Your monitor has already been registered assigning a specific monitor serial # to you.   Applying the monitor   Shave hair from upper left chest.   Hold abrader disc by orange tab.  Rub abrader in 40 strokes over left upper chest as indicated in your monitor instructions.   Clean area with 4 enclosed alcohol pads .  Use all pads to assure are is cleaned thoroughly.  Let dry.   Apply patch as indicated in monitor instructions.  Patch will be place under collarbone on left side of chest with arrow pointing upward.   Rub patch adhesive wings for 2 minutes.Remove white label marked "1".  Remove white label marked "2".  Rub patch adhesive wings for 2 additional minutes.   While looking in a mirror, press and release button in center of patch.  A small green light will flash 3-4 times .  This will be your only indicator the monitor has been turned on.     Do not shower for the first 24 hours.  You may shower after the first 24 hours.   Press button if you feel a symptom. You will hear a small click.  Record Date, Time and Symptom in the Patient Log Book.   When you are ready to remove patch, follow instructions on last 2 pages of Patient Log Book.  Stick patch monitor onto last page of Patient Log Book.   Place Patient Log Book in [place] box.  Use locking tab on box and tape box closed securely.  The [employer] and [employer] has [employer] on it.  Please place in mailbox  as soon as possible.  Your physician should have your test results approximately 7 days after the  monitor has been mailed back to [place].   Call [place] at [phone] if you have questions regarding your ZIO XT patch monitor.  Call them immediately if you see an orange light blinking on your monitor.   If your monitor falls off in less than 4 days contact our Monitor department at [phone].  If your monitor becomes loose or falls off after 4 days call [employer] at [phone] for suggestions on securing your monitor.      Signed, Buford Dresser, MD PhD 06/29/2020 9:32 AM    [place]

## 2020-06-29 NOTE — Patient Instructions (Signed)
Medication Instructions:  Your Physician recommend you continue on your current medication as directed.    *If you need a refill on your cardiac medications before your next appointment, please call your pharmacy*   Lab Work: None   Testing/Procedures: Our physician has recommended that you wear an 14  DAY ZIO-PATCH monitor. The Zio patch cardiac monitor continuously records heart rhythm data for up to 14 days, this is for patients being evaluated for multiple types heart rhythms. For the first 24 hours post application, please avoid getting the Zio monitor wet in the shower or by excessive sweating during exercise. After that, feel free to carry on with regular activities. Keep soaps and lotions away from the ZIO XT Patch.   Someone from our office will call to verify address and mail monitor.     Follow-Up: At [facility], you and your health needs are our priority.  As part of our continuing mission to provide you with exceptional heart care, we have created designated Provider Care Teams.  These Care Teams include your primary Cardiologist (physician) and Advanced Practice Providers (APPs -  Physician Assistants and Nurse Practitioners) who all work together to provide you with the care you need, when you need it.  We recommend signing up for the patient portal called "MyChart".  Sign up information is provided on this After Visit Summary.  MyChart is used to connect with patients for Virtual Visits (Telemedicine).  Patients are able to view lab/test results, encounter notes, upcoming appointments, etc.  Non-urgent messages can be sent to your provider as well.   To learn more about what you can do with MyChart, go to [email or web address].    Your next appointment:   As needed   The format for your next appointment:   In Person  Provider:   Bridgette Christopher, MD  ZIO XT- Long Term Monitor Instructions   Your physician has requested you wear your ZIO patch  monitor__14_____days.   This is a single patch monitor.  [employer] supplies one patch monitor per enrollment.  Additional stickers are not available.   Please do not apply patch if you will be having a Nuclear Stress Test, Echocardiogram, Cardiac CT, MRI, or Chest Xray during the time frame you would be wearing the monitor. The patch cannot be worn during these tests.  You cannot remove and re-apply the ZIO XT patch monitor.   Your ZIO patch monitor will be sent USPS Priority mail from [employer] directly to your home address. The monitor may also be mailed to a PO BOX if home delivery is not available.   It may take 3-5 days to receive your monitor after you have been enrolled.   Once you have received you monitor, please review enclosed instructions.  Your monitor has already been registered assigning a specific monitor serial # to you.   Applying the monitor   Shave hair from upper left chest.   Hold abrader disc by orange tab.  Rub abrader in 40 strokes over left upper chest as indicated in your monitor instructions.   Clean area with 4 enclosed alcohol pads .  Use all pads to assure are is cleaned thoroughly.  Let dry.   Apply patch as indicated in monitor instructions.  Patch will be place under collarbone on left side of chest with arrow pointing upward.   Rub patch adhesive wings for 2 minutes.Remove white label marked "1".  Remove white label marked "2".  Rub patch adhesive wings for 2 additional minutes.     While looking in a mirror, press and release button in center of patch.  A small green light will flash 3-4 times .  This will be your only indicator the monitor has been turned on.     Do not shower for the first 24 hours.  You may shower after the first 24 hours.   Press button if you feel a symptom. You will hear a small click.  Record Date, Time and Symptom in the Patient Log Book.   When you are ready to remove patch, follow instructions on last 2 pages of Patient  Log Book.  Stick patch monitor onto last page of Patient Log Book.   Place Patient Log Book in [place] box.  Use locking tab on box and tape box closed securely.  The Orange and [employer] box has prepaid postage on it.  Please place in mailbox as soon as possible.  Your physician should have your test results approximately 7 days after the monitor has been mailed back to [employer].   Call [employer] Customer Care at [phone] if you have questions regarding your ZIO XT patch monitor.  Call them immediately if you see an orange light blinking on your monitor.   If your monitor falls off in less than 4 days contact our Monitor department at [phone].  If your monitor becomes loose or falls off after 4 days call [employer] at [phone] for suggestions on securing your monitor.     

## 2020-06-29 NOTE — Progress Notes (Signed)
Patient ID: Krista Schwartz, female   DOB: Feb 03, 1956, 64 y.o.   MRN: [id number] Patient enrolled for 14 day ZIO XT long term holter monitor to her home.

## 2020-07-02 ENCOUNTER — Other Ambulatory Visit (INDEPENDENT_AMBULATORY_CARE_PROVIDER_SITE_OTHER): Payer: Commercial Managed Care - PPO

## 2020-07-02 DIAGNOSIS — R002 Palpitations: Secondary | ICD-10-CM

## 2020-07-02 DIAGNOSIS — I493 Ventricular premature depolarization: Secondary | ICD-10-CM | POA: Diagnosis not present

## 2020-07-04 ENCOUNTER — Other Ambulatory Visit: Payer: Self-pay | Admitting: *Deleted

## 2020-07-04 MED ORDER — ESTRADIOL 0.05 MG/24HR TD PTTW
MEDICATED_PATCH | TRANSDERMAL | 6 refills | Status: DC
Start: 2020-07-04 — End: 2021-02-03

## 2020-07-04 NOTE — Telephone Encounter (Signed)
RX RF received from [facility] in [place] for Estradiol patches.  RF sent to pharmacy.

## 2020-07-27 ENCOUNTER — Telehealth: Payer: Self-pay

## 2020-07-27 DIAGNOSIS — N950 Postmenopausal bleeding: Secondary | ICD-10-CM

## 2020-07-27 HISTORY — DX: Postmenopausal bleeding: N95.0

## 2020-07-27 NOTE — Telephone Encounter (Signed)
Caller sates she is needing an appt. for today due c/o throat feels like it is closing up,pt sates her throat hurts and hurts to swallow. Took some ibuprofen. Telephone: [phone]

## 2020-08-05 ENCOUNTER — Telehealth: Payer: Self-pay | Admitting: Obstetrics & Gynecology

## 2020-08-05 NOTE — Telephone Encounter (Signed)
Received notice from pts daughter that the pt is bleeding like a period. Called pt. Left message to call back.   Krista Schwartz, M.D., [name]

## 2020-08-05 NOTE — Telephone Encounter (Signed)
TC from pt. She is bleddign like a period The bleedign is heavy and bright red. This is how heavy her prev menses were.   I have reviewed with pt the need to look at her endometrium as she has had prev endo bx that was neg.   Patient desires surgical management with hysteroscopy with D&C and possible endometrial ablation. The risks of surgery were discussed in detail with the patient including but not limited to: bleeding which may require transfusion or reoperation; infection which may require prolonged hospitalization or re-hospitalization and antibiotic therapy; injury to bowel, bladder, ureters and major vessels or other surrounding organs; need for additional procedures including laparotomy; thromboembolic phenomenon, incisional problems and other postoperative or anesthesia complications.  Patient was told that the likelihood that her condition and symptoms will be treated effectively with this surgical management was very high; the postoperative expectations were also discussed in detail. The patient also understands the alternative treatment options which were discussed in full. All questions were answered.  She was told that she will be contacted by our surgical scheduler regarding the time and date of her surgery; routine preoperative instructions of having nothing to eat or drink after midnight on the day prior to surgery and also coming to the hospital 1 1/2 hours prior to her time of surgery were also emphasized.  She was told she may be called for a preoperative appointment about a week prior to surgery and will be given further preoperative instructions at that visit.   Abbygail [place] L. Harraway-Smith, M.D., [name]

## 2020-08-22 ENCOUNTER — Encounter: Payer: Self-pay | Admitting: Family Medicine

## 2020-08-25 ENCOUNTER — Other Ambulatory Visit: Payer: Self-pay

## 2020-08-25 ENCOUNTER — Encounter (HOSPITAL_BASED_OUTPATIENT_CLINIC_OR_DEPARTMENT_OTHER): Payer: Self-pay | Admitting: Obstetrics & Gynecology

## 2020-08-25 DIAGNOSIS — R059 Cough, unspecified: Secondary | ICD-10-CM

## 2020-08-25 HISTORY — DX: Cough, unspecified: R05.9

## 2020-08-25 NOTE — Progress Notes (Addendum)
Spoke w/ via phone for pre-op interview---pt Lab needs dos---- none              Lab results------lov dr Hughie Closs christopher cardiology lov 06-29-2020 heart monitor for heart palpitations done nov 2021 report not back yet COVID test ------08-26-2021 at 835 am Arrive at -------950 am 08-30-2020  NPO after MN NO Solid Food.  Clear liquids from MN until---850 am then npo Medications to take morning of surgery -----medoxyprogesterone Diabetic medication -----n/a Patient Special Instructions -----none Pre-Op special Istructions -----none Patient verbalized understanding of instructions that were given at this phone interview. Patient denies shortness of breath, chest pain, fever, cough at this phone interview.

## 2020-08-26 ENCOUNTER — Other Ambulatory Visit (HOSPITAL_COMMUNITY)
Admission: RE | Admit: 2020-08-26 | Discharge: 2020-08-26 | Disposition: A | Discharge status: Home / Self Care | Payer: Commercial Managed Care - PPO | Source: Ambulatory Visit | Attending: Obstetrics & Gynecology | Admitting: Obstetrics & Gynecology

## 2020-08-26 DIAGNOSIS — Z01812 Encounter for preprocedural laboratory examination: Secondary | ICD-10-CM | POA: Insufficient documentation

## 2020-08-26 DIAGNOSIS — Z20822 Contact with and (suspected) exposure to covid-19: Secondary | ICD-10-CM | POA: Insufficient documentation

## 2020-08-27 LAB — SARS CORONAVIRUS 2 (TAT 6-24 HRS): SARS Coronavirus 2: NEGATIVE

## 2020-08-30 ENCOUNTER — Ambulatory Visit (HOSPITAL_BASED_OUTPATIENT_CLINIC_OR_DEPARTMENT_OTHER)
Admission: RE | Admit: 2020-08-30 | Discharge: 2020-08-30 | Disposition: A | Discharge status: Home / Self Care | Payer: Commercial Managed Care - PPO | Attending: Obstetrics & Gynecology | Admitting: Obstetrics & Gynecology

## 2020-08-30 ENCOUNTER — Encounter (HOSPITAL_BASED_OUTPATIENT_CLINIC_OR_DEPARTMENT_OTHER)
Admission: RE | Disposition: A | Discharge status: Home / Self Care | Payer: Self-pay | Source: Home / Self Care | Attending: Obstetrics & Gynecology

## 2020-08-30 ENCOUNTER — Ambulatory Visit (HOSPITAL_BASED_OUTPATIENT_CLINIC_OR_DEPARTMENT_OTHER): Payer: Commercial Managed Care - PPO | Admitting: Anesthesiology

## 2020-08-30 ENCOUNTER — Other Ambulatory Visit: Payer: Self-pay

## 2020-08-30 ENCOUNTER — Encounter (HOSPITAL_BASED_OUTPATIENT_CLINIC_OR_DEPARTMENT_OTHER): Payer: Self-pay | Admitting: Obstetrics & Gynecology

## 2020-08-30 DIAGNOSIS — N840 Polyp of corpus uteri: Secondary | ICD-10-CM | POA: Insufficient documentation

## 2020-08-30 DIAGNOSIS — Z85828 Personal history of other malignant neoplasm of skin: Secondary | ICD-10-CM | POA: Insufficient documentation

## 2020-08-30 DIAGNOSIS — Z79899 Other long term (current) drug therapy: Secondary | ICD-10-CM | POA: Insufficient documentation

## 2020-08-30 DIAGNOSIS — Z79890 Hormone replacement therapy (postmenopausal): Secondary | ICD-10-CM | POA: Diagnosis not present

## 2020-08-30 DIAGNOSIS — N950 Postmenopausal bleeding: Secondary | ICD-10-CM | POA: Insufficient documentation

## 2020-08-30 HISTORY — PX: DILITATION & CURRETTAGE/HYSTROSCOPY WITH NOVASURE ABLATION: SHX5568

## 2020-08-30 HISTORY — DX: Palpitations: R00.2

## 2020-08-30 HISTORY — DX: Presence of spectacles and contact lenses: Z97.3

## 2020-08-30 SURGERY — DILATATION & CURETTAGE/HYSTEROSCOPY WITH NOVASURE ABLATION
Anesthesia: General | Site: Vagina

## 2020-08-30 MED ORDER — PROMETHAZINE HCL 25 MG/ML IJ SOLN: 6.2500 mg | INTRAMUSCULAR | Status: DC | PRN

## 2020-08-30 MED ORDER — KETOROLAC TROMETHAMINE 30 MG/ML IJ SOLN
INTRAMUSCULAR | Status: AC
  Filled 2020-08-30: qty 1

## 2020-08-30 MED ORDER — ONDANSETRON HCL 4 MG/2ML IJ SOLN
INTRAMUSCULAR | Status: AC
  Filled 2020-08-30: qty 2

## 2020-08-30 MED ORDER — FENTANYL CITRATE (PF) 100 MCG/2ML IJ SOLN
INTRAMUSCULAR | Status: AC
  Filled 2020-08-30: qty 2

## 2020-08-30 MED ORDER — EPHEDRINE 5 MG/ML INJ
INTRAVENOUS | Status: AC
  Filled 2020-08-30: qty 10

## 2020-08-30 MED ORDER — MIDAZOLAM HCL 2 MG/2ML IJ SOLN
INTRAMUSCULAR | Status: AC
  Filled 2020-08-30: qty 2

## 2020-08-30 MED ORDER — LIDOCAINE 2% (20 MG/ML) 5 ML SYRINGE
INTRAMUSCULAR | Status: DC | PRN
  Administered 2020-08-30: 80 mg via INTRAVENOUS

## 2020-08-30 MED ORDER — SOD CITRATE-CITRIC ACID 500-334 MG/5ML PO SOLN: 30.0000 mL | ORAL | Status: DC

## 2020-08-30 MED ORDER — BUPIVACAINE HCL (PF) 0.5 % IJ SOLN
INTRAMUSCULAR | Status: DC | PRN
  Administered 2020-08-30: 10 mL

## 2020-08-30 MED ORDER — PROPOFOL 10 MG/ML IV BOLUS
INTRAVENOUS | Status: DC | PRN
  Administered 2020-08-30: 140 mg via INTRAVENOUS

## 2020-08-30 MED ORDER — EPHEDRINE SULFATE-NACL 50-0.9 MG/10ML-% IV SOSY
PREFILLED_SYRINGE | INTRAVENOUS | Status: DC | PRN
  Administered 2020-08-30: 10 mg via INTRAVENOUS

## 2020-08-30 MED ORDER — LACTATED RINGERS IV SOLN: INTRAVENOUS | Status: DC

## 2020-08-30 MED ORDER — ACETAMINOPHEN 500 MG PO TABS
ORAL_TABLET | ORAL | Status: AC
  Filled 2020-08-30: qty 2

## 2020-08-30 MED ORDER — POVIDONE-IODINE 10 % EX SWAB: 2.0000 "application " | Freq: Once | CUTANEOUS | Status: DC

## 2020-08-30 MED ORDER — MIDAZOLAM HCL 2 MG/2ML IJ SOLN
INTRAMUSCULAR | Status: DC | PRN
  Administered 2020-08-30: 2 mg via INTRAVENOUS

## 2020-08-30 MED ORDER — KETOROLAC TROMETHAMINE 30 MG/ML IJ SOLN
INTRAMUSCULAR | Status: DC | PRN
  Administered 2020-08-30: 30 mg via INTRAVENOUS

## 2020-08-30 MED ORDER — OXYCODONE HCL 5 MG/5ML PO SOLN: 5.0000 mg | Freq: Once | ORAL | Status: DC | PRN

## 2020-08-30 MED ORDER — PROPOFOL 10 MG/ML IV BOLUS
INTRAVENOUS | Status: AC
  Filled 2020-08-30: qty 20

## 2020-08-30 MED ORDER — FENTANYL CITRATE (PF) 100 MCG/2ML IJ SOLN: 25.0000 ug | INTRAMUSCULAR | Status: DC | PRN

## 2020-08-30 MED ORDER — ACETAMINOPHEN 500 MG PO TABS
1000.0000 mg | ORAL_TABLET | ORAL | Status: AC
  Administered 2020-08-30: 1000 mg via ORAL

## 2020-08-30 MED ORDER — ONDANSETRON HCL 4 MG/2ML IJ SOLN
INTRAMUSCULAR | Status: DC | PRN
  Administered 2020-08-30: 4 mg via INTRAVENOUS

## 2020-08-30 MED ORDER — DEXAMETHASONE SODIUM PHOSPHATE 10 MG/ML IJ SOLN
INTRAMUSCULAR | Status: DC | PRN
  Administered 2020-08-30: 10 mg via INTRAVENOUS

## 2020-08-30 MED ORDER — SODIUM CHLORIDE 0.9 % IR SOLN
Status: DC | PRN
  Administered 2020-08-30: 1000 mL

## 2020-08-30 MED ORDER — FENTANYL CITRATE (PF) 100 MCG/2ML IJ SOLN
INTRAMUSCULAR | Status: DC | PRN
  Administered 2020-08-30: 50 ug via INTRAVENOUS

## 2020-08-30 MED ORDER — OXYCODONE HCL 5 MG PO TABS: 5.0000 mg | ORAL_TABLET | Freq: Once | ORAL | Status: DC | PRN

## 2020-08-30 MED ORDER — IBUPROFEN 600 MG PO TABS: 600.0000 mg | ORAL_TABLET | Freq: Four times a day (QID) | ORAL | 0 refills | Status: DC | PRN

## 2020-08-30 MED ORDER — ACETAMINOPHEN 500 MG PO TABS: 1000.0000 mg | ORAL_TABLET | Freq: Once | ORAL | Status: DC

## 2020-08-30 MED ORDER — DEXAMETHASONE SODIUM PHOSPHATE 10 MG/ML IJ SOLN
INTRAMUSCULAR | Status: AC
  Filled 2020-08-30: qty 1

## 2020-08-30 MED ORDER — LIDOCAINE HCL (PF) 2 % IJ SOLN
INTRAMUSCULAR | Status: AC
  Filled 2020-08-30: qty 5

## 2020-08-30 SURGICAL SUPPLY — 21 items
ABLATOR SURESOUND NOVASURE (ABLATOR) ×2 IMPLANT
CANISTER SUCT 3000ML PPV (MISCELLANEOUS) IMPLANT
CATH ROBINSON RED A/P 16FR (CATHETERS) ×2 IMPLANT
COVER WAND RF STERILE (DRAPES) ×2 IMPLANT
DEVICE MYOSURE LITE (MISCELLANEOUS) ×2 IMPLANT
GAUZE 4X4 16PLY RFD (DISPOSABLE) ×2 IMPLANT
GLOVE BIO SURGEON STRL SZ7 (GLOVE) ×2 IMPLANT
GLOVE BIOGEL PI IND STRL 7.0 (GLOVE) ×1 IMPLANT
GLOVE BIOGEL PI IND STRL 7.5 (GLOVE) ×1 IMPLANT
GLOVE BIOGEL PI INDICATOR 7.0 (GLOVE) ×1
GLOVE BIOGEL PI INDICATOR 7.5 (GLOVE) ×1
GLOVE SURG SS PI 7.5 STRL IVOR (GLOVE) ×2 IMPLANT
GOWN STRL REUS W/TWL LRG LVL3 (GOWN DISPOSABLE) ×4 IMPLANT
KIT PROCEDURE FLUENT (KITS) ×2 IMPLANT
KIT TURNOVER CYSTO (KITS) ×2 IMPLANT
MANIFOLD NEPTUNE II (INSTRUMENTS) ×2 IMPLANT
PACK VAGINAL MINOR WOMEN LF (CUSTOM PROCEDURE TRAY) ×2 IMPLANT
PAD OB MATERNITY 4.3X12.25 (PERSONAL CARE ITEMS) ×2 IMPLANT
SEAL ROD LENS SCOPE MYOSURE (ABLATOR) ×2 IMPLANT
SYR CONTROL 10ML LL (SYRINGE) ×4 IMPLANT
TOWEL OR 17X26 10 PK STRL BLUE (TOWEL DISPOSABLE) ×2 IMPLANT

## 2020-08-30 NOTE — Transfer of Care (Signed)
Immediate Anesthesia Transfer of Care Note  Patient: Krista Schwartz  Procedure(s) Performed: Procedure(s) (LRB): DILATATION & CURETTAGE/HYSTEROSCOPY WITH NOVASURE ABLATION; POLYPECTOMY WITH MYOSURE AND PERICERVICAL BLOCK (N/A)  Patient Location: PACU  Anesthesia Type: General  Level of Consciousness: awake, alert  and oriented  Airway & Oxygen Therapy: Patient Spontanous Breathing and Patient connected to face mask oxygen  Post-op Assessment: Report given to PACU RN and Post -op Vital signs reviewed and stable  Post vital signs: Reviewed and stable  Complications: No apparent anesthesia complicationsLast Vitals:  Vitals Value Taken Time  BP 130/84 08/30/20 1231  Temp    Pulse 66 08/30/20 1233  Resp 16 08/30/20 1233  SpO2 100 % 08/30/20 1233  Vitals shown include unvalidated device data.  Last Pain:  Vitals:   08/30/20 1015  TempSrc: Oral  PainSc: 0-No pain      Patients Stated Pain Goal: 4 (08/30/20 1015)  Complications: No complications documented.

## 2020-08-30 NOTE — Brief Op Note (Signed)
08/30/2020  12:23 PM  PATIENT:  Otho Darner  65 y.o. female  PRE-OPERATIVE DIAGNOSIS:  PMB  POST-OPERATIVE DIAGNOSIS:  PMB  PROCEDURE:  Procedure(s): DILATATION & CURETTAGE/HYSTEROSCOPY WITH NOVASURE ABLATION AND PERICERVICAL BLOCK (N/A)  SURGEON:  Surgeon(s) and Role:    * Willodean Rosenthal, MD - Primary  ANESTHESIA:   general and paracervical block  EBL:  <10cc   BLOOD ADMINISTERED:none  DRAINS: none   LOCAL MEDICATIONS USED:  MARCAINE     SPECIMEN:  Source of Specimen:  Endometrial currettings  DISPOSITION OF SPECIMEN:  PATHOLOGY  COUNTS:  YES  TOURNIQUET:  * No tourniquets in log *  DICTATION: .Note written in EPIC  PLAN OF CARE: Discharge to home after PACU  PATIENT DISPOSITION:  PACU - hemodynamically stable.   Delay start of Pharmacological VTE agent (>24hrs) due to surgical blood loss or risk of bleeding: not applicable  Complications: none immediate  Deola Rewis L. Harraway-Smith, M.D., [place]

## 2020-08-30 NOTE — Anesthesia Procedure Notes (Addendum)
Procedure Name: LMA Insertion Date/Time: 08/30/2020 11:51 AM Performed by: Norva Pavlov, CRNA Pre-anesthesia Checklist: Patient identified, Emergency Drugs available, Suction available and Patient being monitored Patient Re-evaluated:Patient Re-evaluated prior to induction Oxygen Delivery Method: Circle system utilized Preoxygenation: Pre-oxygenation with 100% oxygen Induction Type: IV induction Ventilation: Mask ventilation without difficulty LMA: LMA inserted LMA Size: 4.0 Number of attempts: 1 Airway Equipment and Method: Bite block Placement Confirmation: positive ETCO2 Tube secured with: Tape Dental Injury: Teeth and Oropharynx as per pre-operative assessment

## 2020-08-30 NOTE — Op Note (Signed)
08/30/2020  12:23 PM  PATIENT:  Krista Schwartz  65 y.o. female  PRE-OPERATIVE DIAGNOSIS:  PMB  POST-OPERATIVE DIAGNOSIS:  PMB  PROCEDURE:  Procedure(s): DILATATION & CURETTAGE/HYSTEROSCOPY WITH NOVASURE ABLATION AND PERICERVICAL BLOCK (N/A)  SURGEON:  Surgeon(s) and Role:    * Willodean Rosenthal, MD - Primary  ANESTHESIA:   general and paracervical block  EBL:  <10cc   BLOOD ADMINISTERED:none  DRAINS: none   LOCAL MEDICATIONS USED:  MARCAINE     SPECIMEN:  Source of Specimen:  Endometrial currettings  DISPOSITION OF SPECIMEN:  PATHOLOGY  COUNTS:  YES  TOURNIQUET:  * No tourniquets in log *  DICTATION: .Note written in EPIC  PLAN OF CARE: Discharge to home after PACU  PATIENT DISPOSITION:  PACU - hemodynamically stable.   Delay start of Pharmacological VTE agent (>24hrs) due to surgical blood loss or risk of bleeding: not applicable  Complications: none immediate  The risks, benefits, and alternatives of surgery were explained, understood, and accepted. The consents were signed and all questions were answered. She was taken to the operating room and general anesthesia was applied without complication. She was placed in the dorsal lithotomy position and her vagina and abdomen were prepped and draped in the usual sterile fashion. A bimanual exam revealed a normal size and shape anteverted mobile uterus. Her adnexa were non-enlarged.   A bivalved speculum was placed in the patients' vagina and the anterior lip of the cervix was grasped with a single toothed tenaculum. A paracervical block was performed at 5 and 7 o'clock with 10cc of 0.5% Marcaine.   The endometrial cavity was sounded to 9.5cm and the endocervical length measured 3cm. A hysteroscope was inserted and the endometrium was noted to be thickened with several polyps.  The ostia on both sides were noted.  The scope was removed and a sharp currete was used to scape the lining of the uterus until a gritty  texture was noted throughout.  Specimens were sent to pathology.  The NovaSure device was then inserted and seated using 6.5cm as the cavity length and 2.7cm as the cavity width.  The total activation time was 100 sec at a power of 104w.  The hysteroscope was reinserted and an even burn pattern was noted to the fundus.  The single toothed tenaculum was removed at the end of the case and no bleeding was noted from the cervix.   The patient was extubated and taken to the recovery room in stable condition.  Sponge, lap and instrument counts were correct.  There were no complications.   Joneisha Miles L. Harraway-Smith, M.D., [facility]

## 2020-08-30 NOTE — Anesthesia Preprocedure Evaluation (Addendum)
Anesthesia Evaluation  Patient identified by MRN, date of birth, ID band Patient awake    Reviewed: Allergy & Precautions, NPO status , Patient's Chart, lab work & pertinent test results  History of Anesthesia Complications Negative for: history of anesthetic complications  Airway Mallampati: II  TM Distance: >3 FB Neck ROM: Full  Mouth opening: Limited Mouth Opening  Dental no notable dental hx.    Pulmonary neg pulmonary ROS,    Pulmonary exam normal        Cardiovascular negative cardio ROS Normal cardiovascular exam     Neuro/Psych  Headaches, negative psych ROS   GI/Hepatic Neg liver ROS, GERD  Controlled,  Endo/Other  negative endocrine ROS  Renal/GU negative Renal ROS  negative genitourinary   Musculoskeletal negative musculoskeletal ROS (+)   Abdominal   Peds  Hematology negative hematology ROS (+)   Anesthesia Other Findings Day of surgery medications reviewed with patient.  Reproductive/Obstetrics PMB                           Anesthesia Physical Anesthesia Plan  ASA: II  Anesthesia Plan: General   Post-op Pain Management:    Induction: Intravenous  PONV Risk Score and Plan: 3 and Treatment may vary due to age or medical condition, Ondansetron, Dexamethasone and Midazolam  Airway Management Planned: LMA  Additional Equipment: None  Intra-op Plan:   Post-operative Plan: Extubation in OR  Informed Consent: I have reviewed the patients History and Physical, chart, labs and discussed the procedure including the risks, benefits and alternatives for the proposed anesthesia with the patient or authorized representative who has indicated his/her understanding and acceptance.     Dental advisory given  Plan Discussed with: CRNA  Anesthesia Plan Comments:        Anesthesia Quick Evaluation

## 2020-08-30 NOTE — Anesthesia Postprocedure Evaluation (Signed)
Anesthesia Post Note  Patient: Krista Schwartz  Procedure(s) Performed: DILATATION & CURETTAGE/HYSTEROSCOPY WITH NOVASURE ABLATION; POLYPECTOMY WITH MYOSURE AND PERICERVICAL BLOCK (N/A Vagina )     Patient location during evaluation: PACU Anesthesia Type: General Level of consciousness: awake and alert and oriented Pain management: pain level controlled Vital Signs Assessment: post-procedure vital signs reviewed and stable Respiratory status: spontaneous breathing, nonlabored ventilation and respiratory function stable Cardiovascular status: blood pressure returned to baseline Postop Assessment: no apparent nausea or vomiting Anesthetic complications: no   No complications documented.         Kaylyn Layer

## 2020-08-30 NOTE — H&P (Signed)
Preoperative History and Physical  Krista Schwartz is a 65 y.o. [phone] here for surgical management of persistent post menopausal bleeding.   Proposed surgery: hysteroscopy with dilation and curettage and possible endometrial ablation.    Past Medical History:  Diagnosis Date  . Cancer (HCC)    basal and squamous cell  . Chicken pox   . Complication of anesthesia 1994   tubal ligation slow to awaken and bp dropped went home after surgery  . Cough 08/25/2020   last 2 weeks spoke with med md telehealth 08-23-2020 on azithromycin 250 qd x 5 days, nonproductive cough  . COVID 05/2019   headache and faigue x 4 -5 days all symptoms resolved  . GERD (gastroesophageal reflux disease)    no current meds taken  . H/O seasonal allergies   . Heart palpitations occ since sept 2021   monitor done nov 2021 results not back yet  . Migraines    none in several yrs  . PMB (postmenopausal bleeding) 07/2020  . Wears glasses    Past Surgical History:  Procedure Laterality Date  . BASAL CELL CARCINOMA EXCISION     jan 2021 mohs procedure lip mohs also done 8- 10 yrs ago on lip on other side  . TUBAL LIGATION  1994  . WISDOM TOOTH EXTRACTION  yrs ago   OB History    Gravida  3   Para  3   Term  3   Preterm      AB      Living  3     SAB      IAB      Ectopic      Multiple      Live Births             Patient denies any cervical dysplasia or STIs. Medications Prior to Admission  Medication Sig Dispense Refill Last Dose  . cetirizine (ZYRTEC) 10 MG tablet Take 10 mg by mouth at bedtime.   08/29/2020 at 2000  . medroxyPROGESTERone (PROVERA) 5 MG tablet Take 1 tablet (5 mg total) by mouth daily. (Patient taking differently: Take 5 mg by mouth daily. 1st 5 days of month) 15 tablet 6 08/29/2020 at 2000  . Multiple Vitamin (MULTIVITAMIN) tablet Take 1 tablet by mouth daily.   08/29/2020 at 2000  . azithromycin (ZITHROMAX) 250 MG tablet Take by mouth at bedtime. X 5 days started  08-23-2020 for nonproductive cough   08/26/2020  . benzonatate (TESSALON) 200 MG capsule Take 300 mg by mouth 3 (three) times daily as needed for cough.   08/25/2020  . Cholecalciferol (VITAMIN D3) 5000 units TABS Take 1 tablet by mouth every other day.   08/28/2020  . estradiol (VIVELLE-DOT) 0.05 MG/24HR patch PLACE 1 PATCH ON SKIN TWICE WEEKLY 24 patch 6 08/28/2020  . meclizine (ANTIVERT) 25 MG tablet Take 1 tablet (25 mg total) by mouth as needed for dizziness. 30 tablet 1 08/14/2020    No Known Allergies Social History:   reports that she has never smoked. She has never used smokeless tobacco. She reports current alcohol use. She reports that she does not use drugs. Family History  Problem Relation Age of Onset  . Parkinson's disease Mother   . Heart attack Father   . Cancer Paternal Grandfather        skin  . Cancer Maternal Grandmother        skin  . Hypertension Other   . Hyperlipidemia Other     Review of  Systems: Noncontributory  PHYSICAL EXAM: Height 5\' 5"  (1.651 m), weight 68 kg, last menstrual period 08/27/2009. General appearance - alert, well appearing, and in no distress Chest - clear to auscultation, no wheezes, rales or rhonchi, symmetric air entry Heart - normal rate and regular rhythm Abdomen - soft, nontender, nondistended, no masses or organomegaly Pelvic - examination not indicated Extremities - peripheral pulses normal, no pedal edema, no clubbing or cyanosis  Labs: Results for orders placed or performed during the hospital encounter of 08/26/20 (from the past 336 hour(s))  SARS CORONAVIRUS 2 (TAT 6-24 HRS) Nasopharyngeal Nasopharyngeal Swab   Collection Time: 08/26/20  8:22 AM   Specimen: Nasopharyngeal Swab  Result Value Ref Range   SARS Coronavirus 2 NEGATIVE NEGATIVE    Imaging Studies: 06/17/2020 CLINICAL DATA:  Postmenopausal bleeding  EXAM: TRANSABDOMINAL AND TRANSVAGINAL ULTRASOUND OF PELVIS  TECHNIQUE: Both transabdominal and transvaginal  ultrasound examinations of the pelvis were performed. Transabdominal technique was performed for global imaging of the pelvis including uterus, ovaries, adnexal regions, and pelvic cul-de-sac. It was necessary to proceed with endovaginal exam following the transabdominal exam to visualize the endometrium and left adnexa.  COMPARISON:  None  FINDINGS: Uterus  Measurements: 10.6 x 4.9 x 5.6 cm = volume: 150 mL. No fibroids or other mass visualized.  Endometrium  Thickness: 4 mm.  No focal abnormality visualized.  Right ovary  Measurements: 3.3 x 2.3 x 2.4 cm = volume: 10 mL. Normal appearance/no adnexal mass.  Left ovary  Not visualized.  No adnexal masses are seen.  Other findings  No abnormal free fluid.  IMPRESSION: Nonvisualization of the left ovary. Otherwise unremarkable pelvic sonogram. In the setting of post-menopausal bleeding, this is consistent with a benign etiology such as endometrial atrophy. If bleeding remains unresponsive to hormonal or medical therapy, sonohysterogram should be considered for focal lesion work-up. (Ref: Radiological Reasoning: Algorithmic Workup of Abnormal Vaginal Bleeding with Endovaginal Sonography and Sonohysterography. AJR 200806-04-2000) in Assessment: Patient Active Problem List   Diagnosis Date Noted  . History of skin cancer 06/14/2016  . Post-menopausal bleeding 06/15/2013  . Breast lipoma 06/15/2013    Plan: Patient will undergo surgical management with hysteroscopy with dilation and curettage and possible endometrial ablation.   The risks of surgery were discussed in detail with the patient including but not limited to: bleeding which may require transfusion or reoperation; infection which may require antibiotics; injury to surrounding organs which may involve bowel, bladder, ureters ; need for additional procedures including laparoscopy or laparotomy; thromboembolic phenomenon, surgical site problems and  other postoperative/anesthesia complications. Likelihood of success in alleviating the patient's condition was discussed. Routine postoperative instructions will be reviewed with the patient and her family in detail after surgery.  The patient concurred with the proposed plan, giving informed written consent for the surgery.  Patient has been NPO since last night she will remain NPO for procedure.  Anesthesia and OR aware.  Preoperative prophylactic antibiotics and SCDs ordered on call to the OR.  To OR when ready.  Lyndsie Wallman L. Harraway-Smith, M.D., [facility] 08/30/2020 10:06 AM

## 2020-08-30 NOTE — Discharge Instructions (Signed)
Endometrial Ablation, Care After This sheet gives you information about how to care for yourself after your procedure. Your health care provider may also give you more specific instructions. If you have problems or questions, contact your health care provider. What can I expect after the procedure? After the procedure, it is common to have:  A need to urinate more frequently than usual for the first 24 hours.  Cramps similar to menstrual cramps. These may last for 1-2 days.  Thin, watery vaginal discharge that is light pink or brown in color. This may last a few weeks. Discharge will be heavy for the first few days after your procedure. You may need to wear a sanitary pad.  Nausea.  Vaginal bleeding for 4-6 weeks after the procedure, as tissue healing occurs. Follow these instructions at home: Activity  Do not drive for 24 hours if you were given amedicine to help you relax (sedative) during your procedure.  Do not have sex or put anything into your vagina until your health care provider approves.  Do not lift anything that is heavier than 10 lb (4.5 kg), or the limit that you are told, until your health care provider says that it is safe.  Return to your normal activities as told by your health care provider. Ask your health care provider what activities are safe for you. General instructions   Take over-the-counter and prescription medicines only as told by your health care provider.  Do not take baths, swim, or use a hot tub until your health care provider approves. You will be able to take showers.  Check your vaginal area every day for signs of infection. Check for: ? Redness, swelling, or pain. ? More discharge or blood, instead of less. ? Bad-smelling discharge.  Keep all follow-up visits as told by your health care provider. This is important.  Drink enough fluid to keep your urine pale yellow. Contact a health care provider if you have:  Vaginal redness, swelling, or  pain.  Vaginal discharge or bleeding that gets worse instead of getting better.  Bad-smelling vaginal discharge.  A fever or chills.  Trouble urinating. Get help right away if you have:  Heavy vaginal bleeding.  Severe cramps. Summary  After endometrial ablation, it is normal to have thin, watery vaginal discharge that is light pink or brown in color. This may last a few weeks and may be heavier right after the procedure.  Vaginal bleeding is also normal after the procedure and should get better with time.  Check your vaginal area every day for signs of infection, such as bad-smelling discharge.  Keep all follow-up visits as told by your health care provider. This is important. This information is not intended to replace advice given to you by your health care provider. Make sure you discuss any questions you have with your health care provider. Document Revised: 12/04/2018 Document Reviewed: 06/25/2017 Elsevier Patient Education  2020 [employer]   DISCHARGE INSTRUCTIONS: D&C  The following instructions have been prepared to help you care for yourself upon your return home.   Personal hygiene:  Use sanitary pads for vaginal drainage, not tampons.  Shower the day after your procedure.  NO tub baths, pools or Jacuzzis for 2-3 weeks.  Wipe front to back after using the bathroom.  Activity and limitations:  Do NOT drive or operate any equipment for 24 hours. The effects of anesthesia are still present and drowsiness may result.  Do NOT rest in bed all day.  Walking  is encouraged.  Walk up and down stairs slowly.  You may resume your normal activity in one to two days or as indicated by your physician.  Sexual activity: NO intercourse for at least 2 weeks after the procedure, or as indicated by your physician.  Diet: Eat a light meal as desired this evening. You may resume your usual diet tomorrow.  Return to work: You may resume your work activities in one to  two days or as indicated by your doctor.  What to expect after your surgery: Expect to have vaginal bleeding/discharge for 2-3 days and spotting for up to 10 days. It is not unusual to have soreness for up to 1-2 weeks. You may have a slight burning sensation when you urinate for the first day. Mild cramps may continue for a couple of days. You may have a regular period in 2-6 weeks.  Call your doctor for any of the following:  Excessive vaginal bleeding, saturating and changing one pad every hour.  Inability to urinate 6 hours after discharge from hospital.  Pain not relieved by pain medication.  Fever of 100.4 F or greater.  Unusual vaginal discharge or odor.     Post Anesthesia Home Care Instructions  Activity: Get plenty of rest for the remainder of the day. A responsible individual must stay with you for 24 hours following the procedure.  For the next 24 hours, DO NOT: -Drive a car -Paediatric nurse -Drink alcoholic beverages -Take any medication unless instructed by your physician -Make any legal decisions or sign important papers.  Meals: Start with liquid foods such as gelatin or soup. Progress to regular foods as tolerated. Avoid greasy, spicy, heavy foods. If nausea and/or vomiting occur, drink only clear liquids until the nausea and/or vomiting subsides. Call your physician if vomiting continues.  Special Instructions/Symptoms: Your throat may feel dry or sore from the anesthesia or the breathing tube placed in your throat during surgery. If this causes discomfort, gargle with warm salt water. The discomfort should disappear within 24 hours.  If you had a scopolamine patch placed behind your ear for the management of post- operative nausea and/or vomiting:  1. The medication in the patch is effective for 72 hours, after which it should be removed.  Wrap patch in a tissue and discard in the trash. Wash hands thoroughly with soap and water. 2. You may remove the patch  earlier than 72 hours if you experience unpleasant side effects which may include dry mouth, dizziness or visual disturbances. 3. Avoid touching the patch. Wash your hands with soap and water after contact with the patch.

## 2020-08-31 ENCOUNTER — Encounter (HOSPITAL_BASED_OUTPATIENT_CLINIC_OR_DEPARTMENT_OTHER): Payer: Self-pay | Admitting: Obstetrics & Gynecology

## 2020-08-31 LAB — SURGICAL PATHOLOGY

## 2020-09-05 ENCOUNTER — Encounter: Payer: Self-pay | Admitting: Family Medicine

## 2020-09-05 ENCOUNTER — Telehealth (INDEPENDENT_AMBULATORY_CARE_PROVIDER_SITE_OTHER): Payer: Commercial Managed Care - PPO | Admitting: Family Medicine

## 2020-09-05 ENCOUNTER — Other Ambulatory Visit: Payer: Self-pay

## 2020-09-05 VITALS — BP 120/72 | HR 65 | Temp 97.7°F

## 2020-09-05 DIAGNOSIS — R42 Dizziness and giddiness: Secondary | ICD-10-CM | POA: Diagnosis not present

## 2020-09-05 MED ORDER — ONDANSETRON HCL 8 MG PO TABS: 8.0000 mg | ORAL_TABLET | Freq: Three times a day (TID) | ORAL | 0 refills | Status: DC | PRN

## 2020-09-05 MED ORDER — DIAZEPAM 5 MG PO TABS: 2.5000 mg | ORAL_TABLET | Freq: Three times a day (TID) | ORAL | 0 refills | Status: DC | PRN

## 2020-09-05 MED ORDER — MECLIZINE HCL 12.5 MG PO TABS: 12.5000 mg | ORAL_TABLET | Freq: Three times a day (TID) | ORAL | 0 refills | Status: DC | PRN

## 2020-09-05 NOTE — Progress Notes (Signed)
[facility] [address], [place], [place] [address] [phone]  Date:  09/05/2020   Name:  Krista Schwartz   DOB:  Oct 29, 1955   MRN:  [id number]  PCP:  Darreld Mclean, MD    Chief Complaint: Dizziness (Vertigo 2 days, vomiting, taking meclizine )   History of Present Illness:  Krista Schwartz is a 65 y.o. very pleasant female patient who presents with the following:  Virtual visit today for concern of vertigo Pt notes vertigo since yesterday am- was there when she woke u She notes the room spinning and she is vomiting The vertigo is more or less constant- however on further questioning it really occurs when she moves her head and stops when she is still   She had a mild headache earlier this am but this is now resolved  No tinnitus Her right ear feels a bit congested- otherwise hearing and vision is ok  No recent fall or trauma   She has had occasional mild vertigo for about 4 years, but never this severe- she reports that normally she will vomit once and then feel better She had endometrial ablation last week.  This seemed to go okay, no complications that she is aware of  No fever or other symptoms    Patient Active Problem List   Diagnosis Date Noted  . History of skin cancer 06/14/2016  . Post-menopausal bleeding 06/15/2013  . Breast lipoma 06/15/2013    Past Medical History:  Diagnosis Date  . Cancer (HCC)    basal and squamous cell  . Chicken pox   . Complication of anesthesia 1994   tubal ligation slow to awaken and bp dropped went home after surgery  . Cough 08/25/2020   last 2 weeks spoke with med md telehealth 08-23-2020 on azithromycin 250 qd x 5 days, nonproductive cough  . COVID 05/2019   headache and faigue x 4 -5 days all symptoms resolved  . GERD (gastroesophageal reflux disease)    no current meds taken  . H/O seasonal allergies   . Heart palpitations occ since sept  2021   monitor done nov 2021 results not back yet  . Migraines    none in several yrs  . PMB (postmenopausal bleeding) 07/2020  . Wears glasses     Past Surgical History:  Procedure Laterality Date  . BASAL CELL CARCINOMA EXCISION     jan 2021 mohs procedure lip mohs also done 8- 10 yrs ago on lip on other side  . DILITATION & CURRETTAGE/HYSTROSCOPY WITH NOVASURE ABLATION N/A 08/30/2020   Procedure: DILATATION & CURETTAGE/HYSTEROSCOPY WITH NOVASURE ABLATION; POLYPECTOMY WITH MYOSURE AND PERICERVICAL BLOCK;  Surgeon: Lavonia Drafts, MD;  Location: [facility];  Service: Gynecology;  Laterality: N/A;  . TUBAL LIGATION  1994  . WISDOM TOOTH EXTRACTION  yrs ago    Social History   Tobacco Use  . Smoking status: Never Smoker  . Smokeless tobacco: Never Used  Vaping Use  . Vaping Use: Never used  Substance Use Topics  . Alcohol use: Yes    Alcohol/week: 0.0 standard drinks    Comment: rare  . Drug use: No    Family History  Problem Relation Age of Onset  . Parkinson's disease Mother   . Heart attack Father   . Cancer Paternal Grandfather        skin  . Cancer Maternal Grandmother  skin  . Hypertension Other   . Hyperlipidemia Other     No Known Allergies  Medication list has been reviewed and updated.  Current Outpatient Medications on File Prior to Visit  Medication Sig Dispense Refill  . benzonatate (TESSALON) 200 MG capsule Take 300 mg by mouth 3 (three) times daily as needed for cough.    . cetirizine (ZYRTEC) 10 MG tablet Take 10 mg by mouth at bedtime.    . Cholecalciferol (VITAMIN D3) 5000 units TABS Take 1 tablet by mouth every other day.    . estradiol (VIVELLE-DOT) 0.05 MG/24HR patch PLACE 1 PATCH ON SKIN TWICE WEEKLY 24 patch 6  . ibuprofen (ADVIL) 600 MG tablet Take 1 tablet (600 mg total) by mouth every 6 (six) hours as needed. 30 tablet 0  . meclizine (ANTIVERT) 25 MG tablet Take 1 tablet (25 mg total) by mouth as needed for  dizziness. 30 tablet 1  . medroxyPROGESTERone (PROVERA) 5 MG tablet Take 1 tablet (5 mg total) by mouth daily. (Patient taking differently: Take 5 mg by mouth daily. 1st 5 days of month) 15 tablet 6  . Multiple Vitamin (MULTIVITAMIN) tablet Take 1 tablet by mouth daily.     No current facility-administered medications on file prior to visit.    Review of Systems:  As per HPI- otherwise negative.   Physical Examination: Vitals:   09/05/20 1108  BP: 120/72  Pulse: 65  Temp: 97.7 F (36.5 C)   There were no vitals filed for this visit. There is no height or weight on file to calculate BMI. Ideal Body Weight:    Patient observed over video.  Vitals as above.  She looks miserable, laying down in bed.  No nystagmus or distress, no shortness of breath is noted  Assessment and Plan: Vertigo - Plan: ondansetron (ZOFRAN) 8 MG tablet, diazepam (VALIUM) 5 MG tablet, meclizine (ANTIVERT) 12.5 MG tablet  Virtual visit today for vertigo.  Patient has had what sounds like mild episodes of BPPV in the past.  Her current episode is more severe, she has vomited several times and feels miserable.  We discussed having her come in to be seen in person.  She would like to attend medication and see if this helps  I called in Zofran, diazepam, Antivert.  Described how to use each.  I plan to call her in a few hours and check on how she was doing  Signed Lamar Blinks, MD  Called her at 4:20 She took zofran and valium- she is feeling much better She ate and did ok-vomiting has stopped The spinning is much improved Overall she feels a lot better, she will keep me posted and let me know if symptoms return

## 2020-09-06 ENCOUNTER — Encounter: Payer: Self-pay | Admitting: Family Medicine

## 2020-09-06 DIAGNOSIS — R42 Dizziness and giddiness: Secondary | ICD-10-CM

## 2020-09-06 NOTE — Addendum Note (Signed)
Addended by: Lamar Blinks C on: 09/06/2020 06:07 PM   Modules accepted: Orders

## 2020-09-07 NOTE — Telephone Encounter (Signed)
Pt scheduled for tomorrow at 3pm

## 2020-09-08 ENCOUNTER — Ambulatory Visit (INDEPENDENT_AMBULATORY_CARE_PROVIDER_SITE_OTHER): Payer: Commercial Managed Care - PPO | Admitting: Family Medicine

## 2020-09-08 ENCOUNTER — Other Ambulatory Visit: Payer: Self-pay

## 2020-09-08 ENCOUNTER — Encounter: Payer: Self-pay | Admitting: Family Medicine

## 2020-09-08 VITALS — BP 122/80 | HR 84 | Resp 16 | Ht 65.0 in | Wt 149.0 lb

## 2020-09-08 DIAGNOSIS — H8111 Benign paroxysmal vertigo, right ear: Secondary | ICD-10-CM | POA: Diagnosis not present

## 2020-09-08 DIAGNOSIS — D62 Acute posthemorrhagic anemia: Secondary | ICD-10-CM

## 2020-09-08 NOTE — Patient Instructions (Signed)
It was good to see you today- I do think that you have BPPV, I am glad you are getting somewhat better You may benefit from "repositioning therapy" by a physical therapist.  I called 3 offices and could not get an appt for today or tomorrow as of yet A couple of options  [facility]- you might try calling and see if you can get an appt that fits your schedule

## 2020-09-08 NOTE — Progress Notes (Signed)
[facility] [place], [place], [place] [address] [phone]  Date:  09/08/2020   Name:  Krista Schwartz   DOB:  1956-06-01   MRN:  [id number]  PCP:  Darreld Mclean, MD    Chief Complaint: Dizziness (Taking meclizine, nausea, vomiting, started Sunday/)   History of Present Illness:  Krista Schwartz is a 65 y.o. very pleasant female patient who presents with the following:  Patient seen today for follow-up of vertigo Virtual visit from January 10: Pt notes vertigo since yesterday am- was there when she woke up She notes the room spinning and she is vomiting The vertigo is more or less constant- however on further questioning it really occurs when she moves her head and stops when she is still  She had a mild headache earlier this am but this is now resolved No tinnitus Her right ear feels a bit congested- otherwise hearing and vision is ok  No recent fall or trauma  She has had occasional mild vertigo for about 4 years, but never this severe- she reports that normally she will vomit once and then feel better She had endometrial ablation last week.  This seemed to go okay, no complications that she is aware of No fever or other symptoms   We treated her with Zofran, Valium, meclizine She got somewhat better but symptoms did continue, scheduled visit for today  She did test for covid and was negative Vomiting is resolved She has noted a headache since last night but not severe No ears buzzing or ringing, no vision change  No numbness except she has noted some numbness in one toe prior to onset of vertigo   She does feel that she is making positive progress in getting over her vertigo   Patient Active Problem List   Diagnosis Date Noted  . History of skin cancer 06/14/2016  . Post-menopausal bleeding 06/15/2013  . Breast lipoma 06/15/2013    Past Medical History:  Diagnosis Date  . Cancer  (HCC)    basal and squamous cell  . Chicken pox   . Complication of anesthesia 1994   tubal ligation slow to awaken and bp dropped went home after surgery  . Cough 08/25/2020   last 2 weeks spoke with med md telehealth 08-23-2020 on azithromycin 250 qd x 5 days, nonproductive cough  . COVID 05/2019   headache and faigue x 4 -5 days all symptoms resolved  . GERD (gastroesophageal reflux disease)    no current meds taken  . H/O seasonal allergies   . Heart palpitations occ since sept 2021   monitor done nov 2021 results not back yet  . Migraines    none in several yrs  . PMB (postmenopausal bleeding) 07/2020  . Wears glasses     Past Surgical History:  Procedure Laterality Date  . BASAL CELL CARCINOMA EXCISION     jan 2021 mohs procedure lip mohs also done 8- 10 yrs ago on lip on other side  . DILITATION & CURRETTAGE/HYSTROSCOPY WITH NOVASURE ABLATION N/A 08/30/2020   Procedure: DILATATION & CURETTAGE/HYSTEROSCOPY WITH NOVASURE ABLATION; POLYPECTOMY WITH MYOSURE AND PERICERVICAL BLOCK;  Surgeon: Lavonia Drafts, MD;  Location: [facility];  Service: Gynecology;  Laterality: N/A;  . TUBAL LIGATION  1994  . WISDOM TOOTH EXTRACTION  yrs ago    Social History   Tobacco Use  . Smoking status: Never Smoker  . Smokeless tobacco: Never  Used  Vaping Use  . Vaping Use: Never used  Substance Use Topics  . Alcohol use: Yes    Alcohol/week: 0.0 standard drinks    Comment: rare  . Drug use: No    Family History  Problem Relation Age of Onset  . Parkinson's disease Mother   . Heart attack Father   . Cancer Paternal Grandfather        skin  . Cancer Maternal Grandmother        skin  . Hypertension Other   . Hyperlipidemia Other     No Known Allergies  Medication list has been reviewed and updated.  Current Outpatient Medications on File Prior to Visit  Medication Sig Dispense Refill  . benzonatate (TESSALON) 200 MG capsule Take 300 mg by mouth 3  (three) times daily as needed for cough.    . cetirizine (ZYRTEC) 10 MG tablet Take 10 mg by mouth at bedtime.    . Cholecalciferol (VITAMIN D3) 5000 units TABS Take 1 tablet by mouth every other day.    . diazepam (VALIUM) 5 MG tablet Take 0.5-1 tablets (2.5-5 mg total) by mouth every 8 (eight) hours as needed (vertigo). 20 tablet 0  . estradiol (VIVELLE-DOT) 0.05 MG/24HR patch PLACE 1 PATCH ON SKIN TWICE WEEKLY 24 patch 6  . ibuprofen (ADVIL) 600 MG tablet Take 1 tablet (600 mg total) by mouth every 6 (six) hours as needed. 30 tablet 0  . meclizine (ANTIVERT) 12.5 MG tablet Take 1 tablet (12.5 mg total) by mouth 3 (three) times daily as needed for dizziness. 30 tablet 0  . medroxyPROGESTERone (PROVERA) 5 MG tablet Take 1 tablet (5 mg total) by mouth daily. (Patient taking differently: Take 5 mg by mouth daily. 1st 5 days of month) 15 tablet 6  . Multiple Vitamin (MULTIVITAMIN) tablet Take 1 tablet by mouth daily.    . ondansetron (ZOFRAN) 8 MG tablet Take 1 tablet (8 mg total) by mouth every 8 (eight) hours as needed for nausea or vomiting. 30 tablet 0   No current facility-administered medications on file prior to visit.    Review of Systems:  As per HPI- otherwise negative.   Physical Examination: Vitals:   09/08/20 1451  BP: 122/80  Pulse: 84  Resp: 16  SpO2: 90%   Vitals:   09/08/20 1451  Weight: 149 lb (67.6 kg)  Height: 5\' 5"  (1.651 m)   Body mass index is 24.79 kg/m. Ideal Body Weight: Weight in (lb) to have BMI = 25: 149.9  GEN: no acute distress.  Does not look ill, but is walking slowly and holding head straight to avoid vertigo symptoms HEENT: Atraumatic, Normocephalic.  Ears and Nose: No external deformity. CV: RRR, No M/G/R. No JVD. No thrill. No extra heart sounds. PULM: CTA B, no wheezes, crackles, rhonchi. No retractions. No resp. distress. No accessory muscle use. ABD: S, NT, ND, +BS. No rebound. No HSM. EXTR: No c/c/e PSYCH: Normally interactive.  Conversant.  Positive dix halpike bilaterally but more on the right  Otherwise complete neurologic exam is normal.  Normal strength, sensation, DTR of all extremities.  Normal strength and movement of facial muscles.  Cerebellar function normal  Assessment and Plan: Benign paroxysmal positional vertigo of right ear - Plan: Basic metabolic panel  Acute blood loss anemia - Plan: CBC  Patient today with concern of vertigo, most consistent with BPPV.  She is making progress but unfortunately continues to have symptoms.  I tried to find a physical therapy office that  can see her today or tomorrow for repositioning maneuvers, I was not successful in getting an appointment.  The patient plans to make a few phone calls herself and see if we can find a PT appointment I have asked her to let me know if symptoms are worsening or not continuing to get better Of note, she did recently undergo a gynecologic procedure and had bleeding for about a week afterwards.  Bleeding has now resolved.  We will check CBC to ensure anemia is not contributing  This visit occurred during the SARS-CoV-2 public health emergency.  Safety protocols were in place, including screening questions prior to the visit, additional usage of staff PPE, and extensive cleaning of exam room while observing appropriate contact time as indicated for disinfecting solutions.     Signed Lamar Blinks, MD

## 2020-09-09 ENCOUNTER — Encounter: Payer: Self-pay | Admitting: Family Medicine

## 2020-09-09 DIAGNOSIS — H8111 Benign paroxysmal vertigo, right ear: Secondary | ICD-10-CM

## 2020-09-09 LAB — CBC
HCT: 44 % (ref 36.0–46.0)
Hemoglobin: 14.6 g/dL (ref 12.0–15.0)
MCHC: 33.1 g/dL (ref 30.0–36.0)
MCV: 86.7 fl (ref 78.0–100.0)
Platelets: 240 10*3/uL (ref 150.0–400.0)
RBC: 5.08 Mil/uL (ref 3.87–5.11)
RDW: 13.5 % (ref 11.5–15.5)
WBC: 5.4 10*3/uL (ref 4.0–10.5)

## 2020-09-09 LAB — BASIC METABOLIC PANEL
BUN: 9 mg/dL (ref 6–23)
CO2: 27 mEq/L (ref 19–32)
Calcium: 9.2 mg/dL (ref 8.4–10.5)
Chloride: 102 mEq/L (ref 96–112)
Creatinine, Ser: 1 mg/dL (ref 0.40–1.20)
GFR: 60.56 mL/min (ref >60.00)
Glucose, Bld: 77 mg/dL (ref 70–99)
Potassium: 4 mEq/L (ref 3.5–5.1)
Sodium: 139 mEq/L (ref 135–145)

## 2020-09-21 ENCOUNTER — Ambulatory Visit: Payer: Commercial Managed Care - PPO | Attending: Family Medicine | Admitting: Physical Therapy

## 2020-09-21 ENCOUNTER — Other Ambulatory Visit: Payer: Self-pay

## 2020-09-21 ENCOUNTER — Encounter: Payer: Self-pay | Admitting: Physical Therapy

## 2020-09-21 DIAGNOSIS — R42 Dizziness and giddiness: Secondary | ICD-10-CM

## 2020-09-21 DIAGNOSIS — H8111 Benign paroxysmal vertigo, right ear: Secondary | ICD-10-CM | POA: Diagnosis not present

## 2020-09-21 NOTE — Therapy (Signed)
[facility] [address]  [place], [place], [address] Phone: [phone]   Fax:  [phone]  Physical Therapy Evaluation  Patient Details  Name: Krista Schwartz MRN: [id number] Date of Birth: 1956/05/23 Referring Provider (PT): Lamar Blinks   Encounter Date: 09/21/2020   PT End of Session - 09/21/20 1210    Visit Number 1    Number of Visits 13    Date for PT Re-Evaluation 11/02/20    Authorization Type UHC    PT Start Time 0846    PT Stop Time 0929    PT Time Calculation (min) 43 min    Activity Tolerance Patient tolerated treatment well    Behavior During Therapy [facility] for tasks assessed/performed           Past Medical History:  Diagnosis Date  . Cancer (HCC)    basal and squamous cell  . Chicken pox   . Complication of anesthesia 1994   tubal ligation slow to awaken and bp dropped went home after surgery  . Cough 08/25/2020   last 2 weeks spoke with med md telehealth 08-23-2020 on azithromycin 250 qd x 5 days, nonproductive cough  . COVID 05/2019   headache and faigue x 4 -5 days all symptoms resolved  . GERD (gastroesophageal reflux disease)    no current meds taken  . H/O seasonal allergies   . Heart palpitations occ since sept 2021   monitor done nov 2021 results not back yet  . Migraines    none in several yrs  . PMB (postmenopausal bleeding) 07/2020  . Wears glasses     Past Surgical History:  Procedure Laterality Date  . BASAL CELL CARCINOMA EXCISION     jan 2021 mohs procedure lip mohs also done 8- 10 yrs ago on lip on other side  . DILITATION & CURRETTAGE/HYSTROSCOPY WITH NOVASURE ABLATION N/A 08/30/2020   Procedure: DILATATION & CURETTAGE/HYSTEROSCOPY WITH NOVASURE ABLATION; POLYPECTOMY WITH MYOSURE AND PERICERVICAL BLOCK;  Surgeon: Lavonia Drafts, MD;  Location: [facility];  Service: Gynecology;  Laterality: N/A;  . TUBAL LIGATION  1994  . WISDOM TOOTH  EXTRACTION  yrs ago    There were no vitals filed for this visit.    Subjective Assessment - 09/21/20 0847    Subjective Patient reports that she facetimed with a PT last week who helped put her through a repositioning maneuver for vertigo. Notes that it helped for a little bit. Reports that when she first gets up in the AM and when she rolls to the R in bed she feels dizzy. Episodes last minutes, however when it first began 2 weeks ago her dizziness was constant. Described episodes as spinning, imbalance, woozy. Denies infection, allergies, head trauma around the time of onset. Did report that the day before she washed her hair in the tub and has a surgical procedure on 08/30/20. Vertigo began on 09/04/20.    Pertinent History migraines, GERD, basal and squamous cell CA    Limitations Walking;House hold activities;Standing    Diagnostic tests none    Patient Stated Goals decrease dizziness    Currently in Pain? No/denies              [facility] PT Assessment - 09/21/20 4315      Assessment   Medical Diagnosis BPPV of R ear    Referring Provider (PT) Janett Billow Copland    Onset Date/Surgical Date 09/04/20    Next MD Visit not scheduled  Prior Therapy no      Balance Screen   Has the patient fallen in the past 6 months No    Has the patient had a decrease in activity level because of a fear of falling?  No    Is the patient reluctant to leave their home because of a fear of falling?  No      Home Ecologist residence    Living Arrangements Spouse/significant other    Available Help at Discharge Family    Type of [place] to enter    Entrance Stairs-Number of Steps 4    Entrance Stairs-Rails Right    Home Layout Two level    Alternate Level Stairs-Number of Steps 15    Alternate Level Stairs-Rails Right      Prior Function   Level of Independence Independent    Vocation Full time employment    Vocation Requirements desk  work    Leisure none      Cognition   Overall Cognitive Status Within Functional Limits for tasks assessed      Sensation   Light Touch Appears Intact      Coordination   Gross Motor Movements are Fluid and Coordinated Yes      Posture/Postural Control   Posture/Postural Control No significant limitations                  Vestibular Assessment - 09/21/20 0855      Oculomotor Exam   Oculomotor Alignment Normal    Ocular ROM WNL    Spontaneous Absent    Gaze-induced  Absent    Smooth Pursuits Intact    Saccades Intact    Comment convergence intact      Vestibulo-Ocular Reflex   VOR 1 Head Only (x 1 viewing) report of diffuculty with horizontal VOR but no dizziness; vertical VOR intact    VOR Cancellation Normal   mild dizziness   Comment R and L HIT intact- c/o dizziness      Positional Testing   Dix-Hallpike Dix-Hallpike Right    Sidelying Test Sidelying Right      Dix-Hallpike Right   Dix-Hallpike Right Duration 20    Dix-Hallpike Right Symptoms Upbeat, right rotatory nystagmus      Sidelying Right   Sidelying Right Duration 30 sec   trial 2: 50 sec   Sidelying Right Symptoms Upbeat, right rotatory nystagmus              Objective measurements completed on examination: See above findings.        Vestibular Treatment/Exercise - 09/21/20 0001      Vestibular Treatment/Exercise   Vestibular Treatment Provided Canalith Repositioning    Canalith Repositioning Epley Manuever Right       EPLEY MANUEVER RIGHT   Number of Reps  2    Overall Response No change    Response Details  c/o slight dizziness upon sitting but without nystagmus present                 PT Education - 09/21/20 1210    Education Details prognosis, POC, edu on post-epley precautions    Person(s) Educated Patient    Methods Explanation;Demonstration;Tactile cues;Verbal cues;Handout    Comprehension Verbalized understanding;Returned demonstration            PT  Short Term Goals - 09/21/20 1218      PT SHORT TERM GOAL #1   Title Patient to report  understanding/compliance of post-Epley precautions.    Time 2    Period Weeks    Status New    Target Date 10/05/20             PT Long Term Goals - 09/21/20 1218      PT LONG TERM GOAL #1   Title Patient to be independent with HEP for dizziness as needed.    Time 6    Period Weeks    Status New    Target Date 11/02/20      PT LONG TERM GOAL #2   Title Patient to demonstrate standing head turns 10x without dizziness or LOB.    Time 6    Period Weeks    Status New    Target Date 11/02/20      PT LONG TERM GOAL #3   Title Patient to report no dizziness with positional testing.    Time 6    Period Weeks    Status New    Target Date 11/02/20      PT LONG TERM GOAL #4   Title Patient to report no dizziness with bed mobility.    Time 6    Period Weeks    Status New    Target Date 11/02/20                  Plan - 09/21/20 1213    Clinical Impression Statement Patient is a 65 y/o F presenting to OPPT with c/o dizziness for the past 2 weeks. Episodes last minutes and are described as "spinning, imbalance, woozy." Aggravating factors include rolling in bed, particularly to the R and getting up out of bed. Denies infection, allergies, head trauma around the time of onset. However, reports washing her head in the tub the day before onset. Patient's oculomotor exam grossly intact, however with c/o difficulty focusing with horizontal VOR. Positional testing revealed positive R sidelying test and R DH, thus patient was treated with 2 rounds of R Epley as dizziness did not resolve with 1st trial. Patient noted slight dizziness upon sitting from repositioning maneuver but able to ambulate without issue. Educated patient on post-Epley precautions. Patient reported understanding. Would benefit from skilled PT services 1-2x/week for 6 weeks to address dizziness.    Personal Factors and  Comorbidities Age;Comorbidity 3+;Fitness;Past/Current Experience;Time since onset of injury/illness/exacerbation    Comorbidities migraines, GERD, basal and squamous cell CA    Examination-Activity Limitations Bathing;Sleep;Bed Mobility;Bend;Stairs;Stand;Carry;Transfers;Hygiene/Grooming;Lift;Locomotion Level;Reach Overhead    Examination-Participation Restrictions Church;Cleaning;Shop;Community Activity;Occupation;Laundry;Yard Work;Driving    Stability/Clinical Decision Making Stable/Uncomplicated    Clinical Decision Making Low    Rehab Potential Good    PT Frequency Other (comment)   1-2x   PT Duration 6 weeks    PT Treatment/Interventions ADLs/Self Care Home Management;Canalith Repostioning;Cryotherapy;Moist Heat;Balance training;Therapeutic exercise;Therapeutic activities;Functional mobility training;Stair training;Gait training;Neuromuscular re-education;Patient/family education;Vestibular    PT Next Visit Plan reassess R sidelying/DH    Consulted and Agree with Plan of Care Patient           Patient will benefit from skilled therapeutic intervention in order to improve the following deficits and impairments:  Decreased activity tolerance,Decreased balance,Difficulty walking,Improper body mechanics,Dizziness  Visit Diagnosis: BPPV (benign paroxysmal positional vertigo), right  Dizziness and giddiness     Problem List Patient Active Problem List   Diagnosis Date Noted  . History of skin cancer 06/14/2016  . Post-menopausal bleeding 06/15/2013  . Breast lipoma 06/15/2013    Janene Harvey, PT, DPT 09/21/20 12:21 PM   [facility] [address]  [place], [place], [address] Phone: [phone]   Fax:  [phone]  Name: Krista Schwartz MRN: [id number] Date of Birth: 05/18/1956

## 2020-09-23 ENCOUNTER — Ambulatory Visit: Payer: Commercial Managed Care - PPO | Admitting: Physical Therapy

## 2020-09-23 ENCOUNTER — Other Ambulatory Visit: Payer: Self-pay

## 2020-09-23 ENCOUNTER — Encounter: Payer: Self-pay | Admitting: Physical Therapy

## 2020-09-23 DIAGNOSIS — H8111 Benign paroxysmal vertigo, right ear: Secondary | ICD-10-CM

## 2020-09-23 DIAGNOSIS — R42 Dizziness and giddiness: Secondary | ICD-10-CM

## 2020-09-23 NOTE — Therapy (Addendum)
[facility] [address]  [place], [place], [address] Phone: [phone]   Fax:  [phone]  Physical Therapy Treatment  Patient Details  Name: Krista Schwartz MRN: [id number] Date of Birth: 09/09/1955 Referring Provider (PT): Lamar Blinks   Encounter Date: 09/23/2020   PT End of Session - 09/23/20 0937    Visit Number 2    Number of Visits 13    Date for PT Re-Evaluation 11/02/20    Authorization Type UHC    PT Start Time 0850    PT Stop Time 0928    PT Time Calculation (min) 38 min    Equipment Utilized During Treatment Gait belt    Activity Tolerance Patient tolerated treatment well    Behavior During Therapy [facility] for tasks assessed/performed           Past Medical History:  Diagnosis Date  . Cancer (HCC)    basal and squamous cell  . Chicken pox   . Complication of anesthesia 1994   tubal ligation slow to awaken and bp dropped went home after surgery  . Cough 08/25/2020   last 2 weeks spoke with med md telehealth 08-23-2020 on azithromycin 250 qd x 5 days, nonproductive cough  . COVID 05/2019   headache and faigue x 4 -5 days all symptoms resolved  . GERD (gastroesophageal reflux disease)    no current meds taken  . H/O seasonal allergies   . Heart palpitations occ since sept 2021   monitor done nov 2021 results not back yet  . Migraines    none in several yrs  . PMB (postmenopausal bleeding) 07/2020  . Wears glasses     Past Surgical History:  Procedure Laterality Date  . BASAL CELL CARCINOMA EXCISION     jan 2021 mohs procedure lip mohs also done 8- 10 yrs ago on lip on other side  . DILITATION & CURRETTAGE/HYSTROSCOPY WITH NOVASURE ABLATION N/A 08/30/2020   Procedure: DILATATION & CURETTAGE/HYSTEROSCOPY WITH NOVASURE ABLATION; POLYPECTOMY WITH MYOSURE AND PERICERVICAL BLOCK;  Surgeon: Lavonia Drafts, MD;  Location: [facility];  Service: Gynecology;   Laterality: N/A;  . TUBAL LIGATION  1994  . WISDOM TOOTH EXTRACTION  yrs ago    There were no vitals filed for this visit.   Subjective Assessment - 09/23/20 0850    Subjective Reports that on Wednesday she didn't feel too bad but on Thursday and a little bit today she is feeling "dizzy" rather than spinning. Reports that she was able to follow post-Epley precautions. Feels that her ears have been stopped up for the past 2 days.  0/10 dizziness at rest currently.    Pertinent History migraines, GERD, basal and squamous cell CA    Diagnostic tests none    Patient Stated Goals decrease dizziness    Currently in Pain? No/denies              [facility] PT Assessment - 09/23/20 0001      Standardized Balance Assessment   Standardized Balance Assessment Dynamic Gait Index      Dynamic Gait Index   Level Surface Normal    Change in Gait Speed Normal    Gait with Horizontal Head Turns Mild Impairment    Gait with Vertical Head Turns Normal    Gait and Pivot Turn Normal    Step Over Obstacle Normal    Step Around Obstacles Normal    Steps Normal    Total Score 23  Vestibular Assessment - 09/23/20 0001      Positional Testing   Dix-Hallpike Dix-Hallpike Right      Dix-Hallpike Right   Dix-Hallpike Right Duration 0    Dix-Hallpike Right Symptoms No nystagmus           Treatment:   R & L Brand Daroff 3x each- 3/10 dizziness on sitting up from R  Sitting horizontal VOR 10x- 1/10 dizziness  Sitting on dynadisc horizontal VOR 10x- 2/10 dizziness  Standing horizontal VOR 10x- 1/10 dizziness  Standing horizontal VOR 10x- 3/10 dizziness        [facility] Adult PT Treatment/Exercise - 09/23/20 0001      Neuro Re-ed    Neuro Re-ed Details  romberg on foam EO 30", with EC 30"   mild sway EO, moderate sway EC                 PT Education - 09/23/20 0937    Education Details update to HEP; edu on motion sensitivity/hypofunction    Person(s) Educated  Patient    Methods Explanation;Demonstration;Tactile cues;Verbal cues;Handout    Comprehension Returned demonstration;Verbalized understanding            PT Short Term Goals - 09/23/20 1206      PT SHORT TERM GOAL #1   Title Patient to report understanding/compliance of post-Epley precautions.    Time 2    Period Weeks    Status Achieved    Target Date 10/05/20             PT Long Term Goals - 09/23/20 1206      PT LONG TERM GOAL #1   Title Patient to be independent with HEP for dizziness as needed.    Time 6    Period Weeks    Status On-going      PT LONG TERM GOAL #2   Title Patient to demonstrate standing head turns 10x without dizziness or LOB.    Time 6    Period Weeks    Status On-going      PT LONG TERM GOAL #3   Title Patient to report no dizziness with positional testing.    Time 6    Period Weeks    Status On-going      PT LONG TERM GOAL #4   Title Patient to report no dizziness with bed mobility.    Time 6    Period Weeks    Status On-going                 Plan - 09/23/20 1016    Clinical Impression Statement Patient arrived to session with report of mild remaining dizziness but no c/of spinning with bed mobility. Performed Nestor Lewandowsky to B sides with patient reporting dizziness upon sitting from R sidelying. R DH was negative for dizziness and nystagmus today. Proceeded to work on [employer] training with patient reporting mild intensity dizziness in sitting and standing. Assessed balance on compliant surface, with patient demonstrating moderate sway. Patient did score well on DGI, indicating a decreased risk of falls, however with slight LOB with gait + horizontal head turns. Updated HEP with exercises that were well-tolerated today. Patient reported understanding and without complaints at end of session.    Comorbidities migraines, GERD, basal and squamous cell CA    PT Treatment/Interventions ADLs/Self Care Home Management;Canalith  Repostioning;Cryotherapy;Moist Heat;Balance training;Therapeutic exercise;Therapeutic activities;Functional mobility training;Stair training;Gait training;Neuromuscular re-education;Patient/family education;Vestibular    PT Next Visit Plan progress gaze stabilization    Consulted and  Agree with Plan of Care Patient           Patient will benefit from skilled therapeutic intervention in order to improve the following deficits and impairments:  Decreased activity tolerance,Decreased balance,Difficulty walking,Improper body mechanics,Dizziness  Visit Diagnosis: BPPV (benign paroxysmal positional vertigo), right  Dizziness and giddiness     Problem List Patient Active Problem List   Diagnosis Date Noted  . History of skin cancer 06/14/2016  . Post-menopausal bleeding 06/15/2013  . Breast lipoma 06/15/2013     Janene Harvey, PT, DPT 09/23/20 12:08 PM   [facility] [address]  [place], [place], [address] Phone: [phone]   Fax:  [phone]  Name: Krista Schwartz MRN: [id number] Date of Birth: 1956/06/15  PHYSICAL THERAPY DISCHARGE SUMMARY  Visits from Start of Care: 2  Current functional level related to goals / functional outcomes: Unable to assess; patient cancelled remaining appointments, noting that she no longer needs PT   Remaining deficits: Unable to assess   Education / Equipment: HEP  Plan: Patient agrees to discharge.  Patient goals were partially met. Patient is being discharged due to being pleased with the current functional level.  ?????     Janene Harvey, PT, DPT 10/24/20 1:49 PM

## 2020-09-26 ENCOUNTER — Encounter: Payer: Self-pay | Admitting: Obstetrics & Gynecology

## 2020-09-26 ENCOUNTER — Other Ambulatory Visit: Payer: Self-pay

## 2020-09-26 ENCOUNTER — Ambulatory Visit (INDEPENDENT_AMBULATORY_CARE_PROVIDER_SITE_OTHER): Payer: Commercial Managed Care - PPO | Admitting: Obstetrics & Gynecology

## 2020-09-26 VITALS — BP 130/78 | HR 77 | Ht 65.0 in | Wt 155.0 lb

## 2020-09-26 DIAGNOSIS — N840 Polyp of corpus uteri: Secondary | ICD-10-CM

## 2020-09-26 DIAGNOSIS — N950 Postmenopausal bleeding: Secondary | ICD-10-CM

## 2020-09-26 NOTE — Progress Notes (Signed)
History:  65 y.o.LMP here today for 2 week post op check.Pt is s/p hysteroscopy with polypectomy and endometrial ablation on 08/30/2020.  Pt reports that she is doing well. She is eating and passing stools without difficulty. She reports that she had a severe episode of vertigo after the procedure that was unrelated to the procedure itself.   The following portions of the patient's history were reviewed and updated as appropriate: allergies, current medications, past family history, past medical history, past social history, past surgical history and problem list.  Review of Systems:  Pertinent items are noted in HPI.    Objective:  Physical Exam BP 130/78   Pulse 77   Ht 5\' 5"  (1.651 m)   Wt 155 lb (70.3 kg)   LMP 08/27/2009   BMI 25.79 kg/m   CONSTITUTIONAL: Well-developed, well-nourished female in no acute distress.  HENT:  Normocephalic, atraumatic EYES: Conjunctivae and EOM are normal. No scleral icterus.  NECK: Normal range of motion SKIN: Skin is warm and dry. No rash noted. Not diaphoretic.No pallor. [place]: Alert and oriented to person, place, and time. Normal coordination.  Abd: Soft, nontender and nondistended; her port sites are healing well. .  Pelvic: deferred  Labs and Imaging Surg path 08/30/2020  ENDOMETRIUM, POLYPECTOMY AND CURETTAGE:   - Endometrioid-type polyp. No malignancy identified.   GROSS DESCRIPTION:   Received in formalin is 2.4 x 2.1 x 0.3 cm of tan-pink to red soft  tissue, entirely submitted in 1 block.   Assessment & Plan:  4 week post op check following hysteroscopy with polypectomy and endometrial ablation     Doing well  Reviewed her surg path.   Reviewed post op instructions and activities  Gradual increase in activities  F/u in 4 weeks or sooner prn  May return to full activites  All questions answered.   Lanecia Sliva L. Harraway-Smith, M.D., [name]

## 2020-09-30 ENCOUNTER — Encounter: Payer: Commercial Managed Care - PPO | Admitting: Obstetrics & Gynecology

## 2020-10-07 ENCOUNTER — Ambulatory Visit: Payer: Commercial Managed Care - PPO | Admitting: Physical Therapy

## 2020-11-21 MED FILL — MEDROXYPROGESTERONE 5 MG TA: 5 | 15 days supply | Qty: 15 | Fill #1

## 2021-01-03 NOTE — Progress Notes (Addendum)
[facility] [address], [place], [place] [address] [phone]  Date:  01/09/2021   Name:  Krista Schwartz   DOB:  05/23/1956   MRN:  [id number]  PCP:  Darreld Mclean, MD    Chief Complaint: Annual Exam   History of Present Illness:  Krista Schwartz is a 65 y.o. 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 History of skin cancer 06/14/2016  . Post-menopausal bleeding 06/15/2013  . Breast lipoma 06/15/2013    Past Medical History:  Diagnosis Date  . Cancer (HCC)    basal and squamous cell  . Chicken pox   . Complication of anesthesia 1994   tubal ligation slow to awaken and bp dropped went home after surgery  . Cough 08/25/2020   last 2 weeks spoke with med md telehealth 08-23-2020 on azithromycin 250 qd x 5 days, nonproductive cough  . COVID 05/2019   headache and faigue x 4 -5 days all symptoms resolved  . GERD (gastroesophageal reflux disease)    no current meds taken  . H/O seasonal allergies   . Heart palpitations occ since sept 2021   monitor done nov 2021 results not back yet  . Migraines    none in several yrs  . PMB (postmenopausal bleeding) 07/2020  . Wears glasses     Past Surgical History:  Procedure Laterality Date  . BASAL CELL CARCINOMA  EXCISION     jan 2021 mohs procedure lip mohs also done 8- 10 yrs ago on lip on other side  . DILITATION & CURRETTAGE/HYSTROSCOPY WITH NOVASURE ABLATION N/A 08/30/2020   Procedure: DILATATION & CURETTAGE/HYSTEROSCOPY WITH NOVASURE ABLATION; POLYPECTOMY WITH MYOSURE AND PERICERVICAL BLOCK;  Surgeon: Lavonia Drafts, MD;  Location: [facility];  Service: Gynecology;  Laterality: N/A;  . TUBAL LIGATION  1994  . WISDOM TOOTH EXTRACTION  yrs ago    Social History   Tobacco Use  . Smoking status: Never Smoker  . Smokeless tobacco: Never Used  Vaping Use  . Vaping Use: Never used  Substance Use Topics  . Alcohol use: Yes    Alcohol/week: 0.0 standard drinks    Comment: rare  . Drug use: No    Family History  Problem Relation Age of Onset  . Parkinson's disease Mother   . Heart attack Father   . Cancer Paternal Grandfather        skin  . Cancer Maternal Grandmother        skin  . Hypertension Other   . Hyperlipidemia Other     No Known Allergies  Medication list has been reviewed and updated.  Current Outpatient Medications on File Prior to Visit  Medication Sig Dispense Refill  .  cetirizine (ZYRTEC) 10 MG tablet Take 10 mg by mouth at bedtime.    . Cholecalciferol (VITAMIN D3) 5000 units TABS Take 1 tablet by mouth every other day.    . estradiol (VIVELLE-DOT) 0.05 MG/24HR patch PLACE 1 PATCH ON SKIN TWICE WEEKLY 24 patch 6  . medroxyPROGESTERone (PROVERA) 5 MG tablet TAKE 1 TABLET (5 MG TOTAL) BY MOUTH DAILY. (Patient taking differently: Take 5 mg by mouth daily. 1st 5 days of month) 15 tablet 6  . Multiple Vitamin (MULTIVITAMIN) tablet Take 1 tablet by mouth daily.     No current facility-administered medications on file prior to visit.    Review of Systems:  As per HPI- otherwise negative.   Physical Examination: Vitals:   01/09/21 1038  BP: 118/74  Pulse: 60  Resp: 16  SpO2: 98%   Vitals:   01/09/21 1038  Weight: 153 lb (69.4 kg)   Height: 5\' 5"  (1.651 m)   Body mass index is 25.46 kg/m. Ideal Body Weight: Weight in (lb) to have BMI = 25: 149.9  GEN: no acute distress.  Minimal overweight, looks well  HEENT: Atraumatic, Normocephalic.  Bilateral TM wnl, oropharynx normal.  PEERL,EOMI.    Ears and Nose: No external deformity. CV: RRR, No M/G/R. No JVD. No thrill. No extra heart sounds. PULM: CTA B, no wheezes, crackles, rhonchi. No retractions. No resp. distress. No accessory muscle use. ABD: S, NT, ND. No rebound. No HSM. EXTR: No c/c/e PSYCH: Normally interactive. Conversant.    Assessment and Plan: Physical exam  Screening for diabetes mellitus - Plan: Comprehensive metabolic panel, Hemoglobin A1c  Screening for thyroid disorder - Plan: TSH  Screening for hyperlipidemia - Plan: Lipid panel  Fatigue, unspecified type - Plan: TSH, VITAMIN D 25 Hydroxy (Vit-D Deficiency, Fractures)  Encounter for screening mammogram for malignant neoplasm of breast - Plan: MM 3D SCREEN BREAST BILATERAL    Physical exam today- encouraged healthy diet and exercise routine Will plan further follow- up pending labs encouraged needed immunizations Order mammogram     This visit occurred during the SARS-CoV-2 public health emergency.  Safety protocols were in place, including screening questions prior to the visit, additional usage of staff PPE, and extensive cleaning of exam room while observing appropriate contact time as indicated for disinfecting solutions.    Signed Lamar Blinks, MD  Received her labs as below, message to patient and completed insurance form  Results for orders placed or performed in visit on 01/09/21  Comprehensive metabolic panel  Result Value Ref Range   Sodium 141 135 - 145 mEq/L   Potassium 4.3 3.5 - 5.1 mEq/L   Chloride 104 96 - 112 mEq/L   CO2 29 19 - 32 mEq/L   Glucose, Bld 88 70 - 99 mg/dL   BUN 13 6 - 23 mg/dL   Creatinine, Ser 0.94 0.40 - 1.20 mg/dL   Total Bilirubin 0.6 0.2  - 1.2 mg/dL   Alkaline Phosphatase 74 39 - 117 U/L   AST 13 0 - 37 U/L   ALT 9 0 - 35 U/L   Total Protein 6.3 6.0 - 8.3 g/dL   Albumin 4.2 3.5 - 5.2 g/dL   GFR 65.08 >60.00 mL/min   Calcium 9.3 8.4 - 10.5 mg/dL  Hemoglobin A1c  Result Value Ref Range   Hgb A1c MFr Bld 5.4 4.6 - 6.5 %  Lipid panel  Result Value Ref Range   Cholesterol 197 0 - 200 mg/dL   Triglycerides 121.0 0.0 - 149.0 mg/dL  HDL 50.80 >39.00 mg/dL   VLDL 24.2 0.0 - 40.0 mg/dL   LDL Cholesterol 122 (H) 0 - 99 mg/dL   Total CHOL/HDL Ratio 4    NonHDL 145.95   TSH  Result Value Ref Range   TSH 1.49 0.35 - 4.50 uIU/mL  VITAMIN D 25 Hydroxy (Vit-D Deficiency, Fractures)  Result Value Ref Range   VITD 58.45 30.00 - 100.00 ng/mL   The 10-year ASCVD risk score Mikey Bussing DC Jr., et al., 2013) is: 3.6%   Values used to calculate the score:     Age: 22 years     Sex: Female     Is Non-Hispanic African American: No     Diabetic: No     Tobacco smoker: No     Systolic Blood Pressure: 333 mmHg     Is BP treated: No     HDL Cholesterol: 50.8 mg/dL     Total Cholesterol: 197 mg/dL

## 2021-01-09 ENCOUNTER — Ambulatory Visit (HOSPITAL_BASED_OUTPATIENT_CLINIC_OR_DEPARTMENT_OTHER)
Admission: RE | Admit: 2021-01-09 | Discharge: 2021-01-09 | Disposition: A | Discharge status: Home / Self Care | Payer: Commercial Managed Care - PPO | Source: Ambulatory Visit | Attending: Family Medicine | Admitting: Family Medicine

## 2021-01-09 ENCOUNTER — Encounter: Payer: Self-pay | Admitting: Family Medicine

## 2021-01-09 ENCOUNTER — Encounter (HOSPITAL_BASED_OUTPATIENT_CLINIC_OR_DEPARTMENT_OTHER): Payer: Self-pay

## 2021-01-09 ENCOUNTER — Ambulatory Visit (INDEPENDENT_AMBULATORY_CARE_PROVIDER_SITE_OTHER): Payer: Commercial Managed Care - PPO | Admitting: Family Medicine

## 2021-01-09 ENCOUNTER — Other Ambulatory Visit: Payer: Self-pay

## 2021-01-09 VITALS — BP 118/74 | HR 60 | Resp 16 | Ht 65.0 in | Wt 153.0 lb

## 2021-01-09 DIAGNOSIS — Z1231 Encounter for screening mammogram for malignant neoplasm of breast: Secondary | ICD-10-CM

## 2021-01-09 DIAGNOSIS — Z1329 Encounter for screening for other suspected endocrine disorder: Secondary | ICD-10-CM

## 2021-01-09 DIAGNOSIS — Z131 Encounter for screening for diabetes mellitus: Secondary | ICD-10-CM | POA: Diagnosis not present

## 2021-01-09 DIAGNOSIS — Z0000 Encounter for general adult medical examination without abnormal findings: Secondary | ICD-10-CM

## 2021-01-09 DIAGNOSIS — R5383 Other fatigue: Secondary | ICD-10-CM

## 2021-01-09 DIAGNOSIS — Z13220 Encounter for screening for lipoid disorders: Secondary | ICD-10-CM

## 2021-01-09 DIAGNOSIS — Z23 Encounter for immunization: Secondary | ICD-10-CM

## 2021-01-09 LAB — COMPREHENSIVE METABOLIC PANEL
ALT: 9 U/L (ref 0–35)
AST: 13 U/L (ref 0–37)
Albumin: 4.2 g/dL (ref 3.5–5.2)
Alkaline Phosphatase: 74 U/L (ref 39–117)
BUN: 13 mg/dL (ref 6–23)
CO2: 29 mEq/L (ref 19–32)
Calcium: 9.3 mg/dL (ref 8.4–10.5)
Chloride: 104 mEq/L (ref 96–112)
Creatinine, Ser: 0.94 mg/dL (ref 0.40–1.20)
GFR: 65.08 mL/min (ref >60.00)
Glucose, Bld: 88 mg/dL (ref 70–99)
Potassium: 4.3 mEq/L (ref 3.5–5.1)
Sodium: 141 mEq/L (ref 135–145)
Total Bilirubin: 0.6 mg/dL (ref 0.2–1.2)
Total Protein: 6.3 g/dL (ref 6.0–8.3)

## 2021-01-09 LAB — LIPID PANEL
Cholesterol: 197 mg/dL (ref 0–200)
HDL: 50.8 mg/dL (ref >39.00)
LDL Cholesterol: 122 mg/dL — ABNORMAL HIGH (ref 0–99)
NonHDL: 145.95
Total CHOL/HDL Ratio: 4
Triglycerides: 121 mg/dL (ref 0.0–149.0)
VLDL: 24.2 mg/dL (ref 0.0–40.0)

## 2021-01-09 LAB — VITAMIN D 25 HYDROXY (VIT D DEFICIENCY, FRACTURES): VITD: 58.45 ng/mL (ref 30.00–100.00)

## 2021-01-09 LAB — HEMOGLOBIN A1C: Hgb A1c MFr Bld: 5.4 % (ref 4.6–6.5)

## 2021-01-09 LAB — TSH: TSH: 1.49 u[IU]/mL (ref 0.35–4.50)

## 2021-01-09 NOTE — Patient Instructions (Signed)
It was great to see you again today.  I will be in touch with your labs as soon as possible, and we will send in your insurance form  Please consider getting the shingles vaccine and a COVID booster at your convenience  Will order the mammogram for you, stop by imaging and see if this can be done today  Work on exercise as your schedule allows    Health Maintenance, Female Adopting a healthy lifestyle and getting preventive care are important in promoting health and wellness. Ask your health care provider about:  The right schedule for you to have regular tests and exams.  Things you can do on your own to prevent diseases and keep yourself healthy. What should I know about diet, weight, and exercise? Eat a healthy diet  Eat a diet that includes plenty of vegetables, fruits, low-fat dairy products, and lean protein.  Do not eat a lot of foods that are high in solid fats, added sugars, or sodium.   Maintain a healthy weight Body mass index (BMI) is used to identify weight problems. It estimates body fat based on height and weight. Your health care provider can help determine your BMI and help you achieve or maintain a healthy weight. Get regular exercise Get regular exercise. This is one of the most important things you can do for your health. Most adults should:  Exercise for at least 150 minutes each week. The exercise should increase your heart rate and make you sweat (moderate-intensity exercise).  Do strengthening exercises at least twice a week. This is in addition to the moderate-intensity exercise.  Spend less time sitting. Even light physical activity can be beneficial. Watch cholesterol and blood lipids Have your blood tested for lipids and cholesterol at 63 years of age, then have this test every 5 years. Have your cholesterol levels checked more often if:  Your lipid or cholesterol levels are high.  You are older than 63 years of age.  You are at high risk for heart  disease. What should I know about cancer screening? Depending on your health history and family history, you may need to have cancer screening at various ages. This may include screening for:  Breast cancer.  Cervical cancer.  Colorectal cancer.  Skin cancer.  Lung cancer. What should I know about heart disease, diabetes, and high blood pressure? Blood pressure and heart disease  High blood pressure causes heart disease and increases the risk of stroke. This is more likely to develop in people who have high blood pressure readings, are of African descent, or are overweight.  Have your blood pressure checked: ? Every 3-5 years if you are 69-58 years of age. ? Every year if you are 98 years old or older. Diabetes Have regular diabetes screenings. This checks your fasting blood sugar level. Have the screening done:  Once every three years after age 47 if you are at a normal weight and have a low risk for diabetes.  More often and at a younger age if you are overweight or have a high risk for diabetes. What should I know about preventing infection? Hepatitis B If you have a higher risk for hepatitis B, you should be screened for this virus. Talk with your health care provider to find out if you are at risk for hepatitis B infection. Hepatitis C Testing is recommended for:  Everyone born from [birth year] through 1963.  Anyone with known risk factors for hepatitis C. Sexually transmitted infections (STIs)  Get screened  for STIs, including gonorrhea and chlamydia, if: ? You are sexually active and are younger than 63 years of age. ? You are older than 63 years of age and your health care provider tells you that you are at risk for this type of infection. ? Your sexual activity has changed since you were last screened, and you are at increased risk for chlamydia or gonorrhea. Ask your health care provider if you are at risk.  Ask your health care provider about whether you are at high  risk for HIV. Your health care provider may recommend a prescription medicine to help prevent HIV infection. If you choose to take medicine to prevent HIV, you should first get tested for HIV. You should then be tested every 3 months for as long as you are taking the medicine. Pregnancy  If you are about to stop having your period (premenopausal) and you may become pregnant, seek counseling before you get pregnant.  Take 400 to 800 micrograms (mcg) of folic acid every day if you become pregnant.  Ask for birth control (contraception) if you want to prevent pregnancy. Osteoporosis and menopause Osteoporosis is a disease in which the bones lose minerals and strength with aging. This can result in bone fractures. If you are 61 years old or older, or if you are at risk for osteoporosis and fractures, ask your health care provider if you should:  Be screened for bone loss.  Take a calcium or vitamin D supplement to lower your risk of fractures.  Be given hormone replacement therapy (HRT) to treat symptoms of menopause. Follow these instructions at home: Lifestyle  Do not use any products that contain nicotine or tobacco, such as cigarettes, e-cigarettes, and chewing tobacco. If you need help quitting, ask your health care provider.  Do not use street drugs.  Do not share needles.  Ask your health care provider for help if you need support or information about quitting drugs. Alcohol use  Do not drink alcohol if: ? Your health care provider tells you not to drink. ? You are pregnant, may be pregnant, or are planning to become pregnant.  If you drink alcohol: ? Limit how much you use to 0-1 drink a day. ? Limit intake if you are breastfeeding.  Be aware of how much alcohol is in your drink. In the [place] one drink equals one 12 oz bottle of beer (355 mL), one 5 oz glass of wine (148 mL), or one 1 oz glass of hard liquor (44 mL). General instructions  Schedule regular health, dental,  and eye exams.  Stay current with your vaccines.  Tell your health care provider if: ? You often feel depressed. ? You have ever been abused or do not feel safe at home. Summary  Adopting a healthy lifestyle and getting preventive care are important in promoting health and wellness.  Follow your health care provider's instructions about healthy diet, exercising, and getting tested or screened for diseases.  Follow your health care provider's instructions on monitoring your cholesterol and blood pressure. This information is not intended to replace advice given to you by your health care provider. Make sure you discuss any questions you have with your health care provider. Document Revised: 08/06/2018 Document Reviewed: 08/06/2018 Elsevier Patient Education  2021 [employer].

## 2021-02-03 ENCOUNTER — Other Ambulatory Visit: Payer: Self-pay

## 2021-02-03 ENCOUNTER — Encounter: Payer: Self-pay | Admitting: Obstetrics & Gynecology

## 2021-02-03 ENCOUNTER — Ambulatory Visit: Payer: Commercial Managed Care - PPO | Admitting: Obstetrics & Gynecology

## 2021-02-03 ENCOUNTER — Other Ambulatory Visit (HOSPITAL_BASED_OUTPATIENT_CLINIC_OR_DEPARTMENT_OTHER): Payer: Self-pay

## 2021-02-03 VITALS — BP 117/81 | HR 61 | Ht 65.0 in | Wt 153.0 lb

## 2021-02-03 DIAGNOSIS — N950 Postmenopausal bleeding: Secondary | ICD-10-CM | POA: Diagnosis not present

## 2021-02-03 DIAGNOSIS — N858 Other specified noninflammatory disorders of uterus: Secondary | ICD-10-CM

## 2021-02-03 DIAGNOSIS — N951 Menopausal and female climacteric states: Secondary | ICD-10-CM | POA: Diagnosis not present

## 2021-02-03 DIAGNOSIS — R635 Abnormal weight gain: Secondary | ICD-10-CM | POA: Diagnosis not present

## 2021-02-03 MED ORDER — ESTRADIOL 0.05 MG/24HR TD PTTW
MEDICATED_PATCH | TRANSDERMAL | 6 refills | Status: DC
  Filled 2021-02-03: qty 24, 84d supply, fill #0
  Filled 2021-04-14: qty 8, 28d supply, fill #0

## 2021-02-03 MED ORDER — MEDROXYPROGESTERONE ACETATE 2.5 MG PO TABS
2.5000 mg | ORAL_TABLET | Freq: Every day | ORAL | 4 refills | Status: DC
  Filled 2021-02-03: qty 5, 28d supply, fill #0
  Filled 2021-03-20: qty 5, 28d supply, fill #1
  Filled 2021-04-24: qty 5, 28d supply, fill #2
  Filled 2021-05-19: qty 5, 28d supply, fill #3
  Filled 2021-06-21: qty 5, 28d supply, fill #4
  Filled 2021-07-24: qty 5, 28d supply, fill #5
  Filled 2021-08-28: qty 5, 28d supply, fill #6
  Filled 2021-09-24: qty 5, 28d supply, fill #7
  Filled 2021-10-25: qty 5, 28d supply, fill #8
  Filled 2021-11-25: qty 5, 28d supply, fill #9
  Filled ????-??-??: fill #10

## 2021-02-03 NOTE — Progress Notes (Signed)
Pt is here today for follow up from surgery in January. Pt is still having some bleeding 4-5 days after stopping the Provera and lasts approximately 3 days. Pt states it is light and is wearing a mini pad, changing 2-3 times per day.

## 2021-02-03 NOTE — Progress Notes (Signed)
History:  65 y.o. G3P3003 here today for eval of PMPB and hot flushes. Pt reports that her hot flushes are improved. She does report spotting for 2-3 days after stopping the Provera. She denies other site effects. She reports weight gain that she does not attribute to the ERT.  Her TSH was recently checked by her PCP. Pt is s/p hysteroscopy with polypectomy in Jan 2022.   The following portions of the patient's history were reviewed and updated as appropriate: allergies, current medications, past family history, past medical history, past social history, past surgical history and problem list.  Review of Systems:  Pertinent items are noted in HPI.    Objective:  Physical Exam Blood pressure 117/81, pulse 61, height 5\' 5"  (1.651 m), weight 153 lb (69.4 kg), last menstrual period 08/27/2009.  CONSTITUTIONAL: Well-developed, well-nourished female in no acute distress.  HENT:  Normocephalic, atraumatic EYES: Conjunctivae and EOM are normal. No scleral icterus.  NECK: Normal range of motion SKIN: Skin is warm and dry. No rash noted. Not diaphoretic.No pallor. Krista Schwartz: Alert and oriented to person, place, and time. Normal coordination.    Labs and Imaging MM 3D SCREEN BREAST BILATERAL  Result Date: 01/09/2021 CLINICAL DATA:  Screening. EXAM: DIGITAL SCREENING BILATERAL MAMMOGRAM WITH TOMOSYNTHESIS AND CAD TECHNIQUE: Bilateral screening digital craniocaudal and mediolateral oblique mammograms were obtained. Bilateral screening digital breast tomosynthesis was performed. The images were evaluated with computer-aided detection. COMPARISON:  Previous exam(s). ACR Breast Density Category c: The breast tissue is heterogeneously dense, which may obscure small masses. FINDINGS: There are no findings suspicious for malignancy. The images were evaluated with computer-aided detection. IMPRESSION: No mammographic evidence of malignancy. A result letter of this screening mammogram will be mailed directly to  the patient. RECOMMENDATION: Screening mammogram in one year. (Code:SM-B-01Y) BI-RADS CATEGORY  1: Negative. Electronically Signed   By: Kristopher Oppenheim M.D.   On: 01/09/2021 14:07    Assessment & Plan:  Krista Schwartz was seen today for post-op follow-up.  Diagnoses and all orders for this visit:  Post-menopausal bleeding -     medroxyPROGESTERone (PROVERA) 2.5 MG tablet; Take 1 tablet (2.5 mg total) by mouth daily. Days 1-5 of the month.  Atrophic endometrium -     medroxyPROGESTERone (PROVERA) 2.5 MG tablet; Take 1 tablet (2.5 mg total) by mouth daily. Days 1-5 of the month.  Menopausal hot flushes -     estradiol (VIVELLE-DOT) 0.05 MG/24HR patch; PLACE 1 PATCH ON SKIN TWICE WEEKLY  Excessive weight gain  The provera was decreased from 5mg  on days 1-5 monthly to 2.5mg  days 1-5 monthly to help with withdrawal bleeding.   F/u in 1 year or sooner prn   Krista Schwartz, M.D., Krista Schwartz

## 2021-02-06 ENCOUNTER — Other Ambulatory Visit (HOSPITAL_BASED_OUTPATIENT_CLINIC_OR_DEPARTMENT_OTHER): Payer: Self-pay

## 2021-03-20 ENCOUNTER — Encounter: Payer: Self-pay | Admitting: Obstetrics & Gynecology

## 2021-03-20 ENCOUNTER — Other Ambulatory Visit: Payer: Self-pay | Admitting: Obstetrics & Gynecology

## 2021-03-20 ENCOUNTER — Other Ambulatory Visit (HOSPITAL_BASED_OUTPATIENT_CLINIC_OR_DEPARTMENT_OTHER): Payer: Self-pay

## 2021-03-20 ENCOUNTER — Ambulatory Visit (INDEPENDENT_AMBULATORY_CARE_PROVIDER_SITE_OTHER): Payer: Commercial Managed Care - PPO | Admitting: Obstetrics & Gynecology

## 2021-03-20 ENCOUNTER — Other Ambulatory Visit: Payer: Self-pay

## 2021-03-20 VITALS — BP 135/72 | HR 53 | Ht 65.0 in | Wt 150.0 lb

## 2021-03-20 DIAGNOSIS — N643 Galactorrhea not associated with childbirth: Secondary | ICD-10-CM | POA: Diagnosis not present

## 2021-03-20 DIAGNOSIS — N6310 Unspecified lump in the right breast, unspecified quadrant: Secondary | ICD-10-CM

## 2021-03-20 DIAGNOSIS — N63 Unspecified lump in unspecified breast: Secondary | ICD-10-CM

## 2021-03-20 NOTE — Progress Notes (Signed)
History:  65 y.o. G3P3003 here today for eval of hard mass on right breast near her nipple.  She noticed this while completing a self breat exam. This was not present when she had her mammogram. She reports that it is tender now but, is not sure if that's related to feeling it. She does report that she periodically has crusting of the nipple on that side. She had a normal screening mammogram 01/09/2021.  She denies FH of breast or ovarian cancer.     The following portions of the patient's history were reviewed and updated as appropriate: allergies, current medications, past family history, past medical history, past social history, past surgical history and problem list.  Review of Systems:  Pertinent items are noted in HPI.    Objective:  Physical Exam Blood pressure 135/72, pulse (!) 53, height '5\' 5"'$  (1.651 m), weight 150 lb (68 kg), last menstrual period 08/27/2009.  CONSTITUTIONAL: Well-developed, well-nourished female in no acute distress.  HENT:  Normocephalic, atraumatic EYES: Conjunctivae and EOM are normal. No scleral icterus.  NECK: Normal range of motion SKIN: Skin is warm and dry. No rash noted. Not diaphoretic.No pallor. [place]: Alert and oriented to person, place, and time. Normal coordination.  Breast exam: breast are symmetric. There are no ext skin changes. She does have some sun burn on the upper check wall. There are no palpable masses on the left side. On the right side inferior to the niopple at 6 o'clock there is a small pea sized mass that is mobile. THere is not nipple discharge noted.   Labs and Imaging 01/09/2021 CLINICAL DATA:  Screening.   EXAM: DIGITAL SCREENING BILATERAL MAMMOGRAM WITH TOMOSYNTHESIS AND CAD   TECHNIQUE: Bilateral screening digital craniocaudal and mediolateral oblique mammograms were obtained. Bilateral screening digital breast tomosynthesis was performed. The images were evaluated with computer-aided detection.   COMPARISON:  Previous  exam(s).   ACR Breast Density Category c: The breast tissue is heterogeneously dense, which may obscure small masses.   FINDINGS: There are no findings suspicious for malignancy. The images were evaluated with computer-aided detection.   IMPRESSION: No mammographic evidence of malignancy. A result letter of this screening mammogram will be mailed directly to the patient.   RECOMMENDATION: Screening mammogram in one year. (Code:SM-B-01Y)   BI-RADS CATEGORY  1: Negative.  Assessment & Plan:  Krista Schwartz was seen today for breast problem.  Diagnoses and all orders for this visit:  Breast mass, right -     MM Digital Diagnostic Unilat R; Future  Galactorrhea -     MM Digital Diagnostic Unilat R; Future   F/u to be determined following the studies. I will follow the results with pt. She may require a ductogram.   Shain Pauwels L. Harraway-Smith, M.D., Krista Schwartz

## 2021-03-22 ENCOUNTER — Other Ambulatory Visit: Payer: Self-pay

## 2021-03-22 DIAGNOSIS — B379 Candidiasis, unspecified: Secondary | ICD-10-CM

## 2021-03-22 MED ORDER — FLUCONAZOLE 150 MG PO TABS: 150.0000 mg | ORAL_TABLET | Freq: Once | ORAL | 0 refills | Status: AC

## 2021-03-24 ENCOUNTER — Ambulatory Visit
Admission: RE | Admit: 2021-03-24 | Discharge: 2021-03-24 | Disposition: A | Discharge status: Home / Self Care | Payer: Commercial Managed Care - PPO | Source: Ambulatory Visit | Attending: Obstetrics & Gynecology | Admitting: Obstetrics & Gynecology

## 2021-03-24 ENCOUNTER — Other Ambulatory Visit: Payer: Self-pay

## 2021-03-24 DIAGNOSIS — N643 Galactorrhea not associated with childbirth: Secondary | ICD-10-CM

## 2021-03-24 DIAGNOSIS — N63 Unspecified lump in unspecified breast: Secondary | ICD-10-CM

## 2021-03-24 DIAGNOSIS — N6310 Unspecified lump in the right breast, unspecified quadrant: Secondary | ICD-10-CM

## 2021-04-10 ENCOUNTER — Other Ambulatory Visit: Payer: Commercial Managed Care - PPO

## 2021-04-14 ENCOUNTER — Other Ambulatory Visit (HOSPITAL_BASED_OUTPATIENT_CLINIC_OR_DEPARTMENT_OTHER): Payer: Self-pay

## 2021-04-19 ENCOUNTER — Telehealth: Payer: Self-pay

## 2021-04-19 NOTE — Telephone Encounter (Signed)
We received a fax from [facility] requesting a 90 days Rx for Estradiol patches.   Per Ronalee Belts at [facility], the insurance will only pay for 30 days supply.  Ronalee Belts states that he will decline the 90 day supply request and fill the 30 days supply. Understanding was voiced. Ruston Fedora l Natalie Leclaire, CMA

## 2021-04-24 ENCOUNTER — Other Ambulatory Visit (HOSPITAL_BASED_OUTPATIENT_CLINIC_OR_DEPARTMENT_OTHER): Payer: Self-pay

## 2021-05-22 ENCOUNTER — Other Ambulatory Visit (HOSPITAL_BASED_OUTPATIENT_CLINIC_OR_DEPARTMENT_OTHER): Payer: Self-pay

## 2021-06-21 ENCOUNTER — Other Ambulatory Visit (HOSPITAL_BASED_OUTPATIENT_CLINIC_OR_DEPARTMENT_OTHER): Payer: Self-pay

## 2021-07-24 ENCOUNTER — Other Ambulatory Visit (HOSPITAL_BASED_OUTPATIENT_CLINIC_OR_DEPARTMENT_OTHER): Payer: Self-pay

## 2021-08-28 ENCOUNTER — Other Ambulatory Visit (HOSPITAL_BASED_OUTPATIENT_CLINIC_OR_DEPARTMENT_OTHER): Payer: Self-pay

## 2021-09-25 ENCOUNTER — Other Ambulatory Visit (HOSPITAL_BASED_OUTPATIENT_CLINIC_OR_DEPARTMENT_OTHER): Payer: Self-pay

## 2021-10-25 ENCOUNTER — Other Ambulatory Visit (HOSPITAL_BASED_OUTPATIENT_CLINIC_OR_DEPARTMENT_OTHER): Payer: Self-pay

## 2021-10-31 ENCOUNTER — Other Ambulatory Visit (HOSPITAL_BASED_OUTPATIENT_CLINIC_OR_DEPARTMENT_OTHER): Payer: Self-pay

## 2021-10-31 ENCOUNTER — Telehealth: Payer: Self-pay

## 2021-10-31 MED ORDER — FLUCONAZOLE 150 MG PO TABS
150.0000 mg | ORAL_TABLET | Freq: Once | ORAL | 0 refills | Status: AC
Start: 2021-10-31 — End: 2021-11-02
  Filled 2021-10-31: qty 2, 2d supply, fill #0

## 2021-10-31 NOTE — Telephone Encounter (Signed)
Patient recently prescribed antibiotic from her ENT and is having vaignal itching and white discharge. Will treat for yeast infection patient to return to clinic if symptoms not resolved with Diflucan. Kathrene Alu RN  ?

## 2021-11-27 ENCOUNTER — Other Ambulatory Visit (HOSPITAL_BASED_OUTPATIENT_CLINIC_OR_DEPARTMENT_OTHER): Payer: Self-pay

## 2021-11-27 ENCOUNTER — Other Ambulatory Visit (HOSPITAL_BASED_OUTPATIENT_CLINIC_OR_DEPARTMENT_OTHER): Payer: Self-pay | Admitting: Family Medicine

## 2021-11-27 ENCOUNTER — Other Ambulatory Visit (HOSPITAL_BASED_OUTPATIENT_CLINIC_OR_DEPARTMENT_OTHER): Payer: Self-pay | Admitting: Obstetrics & Gynecology

## 2021-11-27 DIAGNOSIS — Z1231 Encounter for screening mammogram for malignant neoplasm of breast: Secondary | ICD-10-CM

## 2021-11-30 ENCOUNTER — Encounter: Payer: Self-pay | Admitting: General Practice

## 2021-12-11 ENCOUNTER — Other Ambulatory Visit: Payer: Self-pay

## 2021-12-11 ENCOUNTER — Other Ambulatory Visit (HOSPITAL_BASED_OUTPATIENT_CLINIC_OR_DEPARTMENT_OTHER): Payer: Self-pay

## 2021-12-14 ENCOUNTER — Other Ambulatory Visit: Payer: Self-pay

## 2021-12-14 ENCOUNTER — Other Ambulatory Visit (HOSPITAL_BASED_OUTPATIENT_CLINIC_OR_DEPARTMENT_OTHER): Payer: Self-pay

## 2021-12-14 DIAGNOSIS — N858 Other specified noninflammatory disorders of uterus: Secondary | ICD-10-CM

## 2021-12-14 DIAGNOSIS — N950 Postmenopausal bleeding: Secondary | ICD-10-CM

## 2021-12-14 MED ORDER — MEDROXYPROGESTERONE ACETATE 2.5 MG PO TABS
2.5000 mg | ORAL_TABLET | Freq: Every day | ORAL | 4 refills | Status: DC
  Filled 2021-12-14 – 2021-12-18 (×2): qty 5, 28d supply, fill #0
  Filled 2022-01-23: qty 5, 28d supply, fill #1
  Filled 2022-02-12: qty 5, 28d supply, fill #2
  Filled 2022-03-15: qty 5, 28d supply, fill #3

## 2021-12-18 ENCOUNTER — Other Ambulatory Visit (HOSPITAL_BASED_OUTPATIENT_CLINIC_OR_DEPARTMENT_OTHER): Payer: Self-pay

## 2021-12-19 ENCOUNTER — Other Ambulatory Visit (HOSPITAL_BASED_OUTPATIENT_CLINIC_OR_DEPARTMENT_OTHER): Payer: Self-pay

## 2022-01-07 NOTE — Patient Instructions (Addendum)
It was good to see you again today, I will be in touch with your labs ?I would recommend getting a COVID booster if not done already ?We can also get your shingles series done at your convenience ?Try adding in some strength training to build muscle and increase metabolism ?Cologuard is ordered  ? ?Overall it appears you are doing a great job- keep up the good work!   ?

## 2022-01-07 NOTE — Progress Notes (Addendum)
Therapist, music at [employer] ?[address], [address] ?[place],  [address] ?336 [phone] ?Fax [phone] ? ?Date:  01/10/2022  ? ?Name:  Krista Schwartz   DOB:  05-27-1956   MRN:  [id number] ? ?PCP:  Darreld Mclean, MD  ? ? ?Chief Complaint: Annual Exam (Concerns/ questions: 1. Pain in the left index finger. 2. Cramps in toes. /HIV screen due/Cologard: none recently/Shingrix: none) ? ? ?History of Present Illness: ? ?Krista Schwartz is a 66 y.o. very pleasant female patient who presents with the following: ? 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 ?Shingrix- she will do later, she has too much work right now ?Cologuard due for update- pt would like to re-order for screening  ?Pap is due this fall, she sees GYN ?Mammogram is scheduled ?Can update labs today ?She is following up with derm  ?Her only concern has been more frequent foot cramps- she added magnesium which has helped some ?She enjoys walking for exercise  ?Never a smoker, rare alcohol  ? ?Wt Readings from Last 3 Encounters:  ?01/10/22 155 lb 6.4 oz (70.5 kg)  ?03/20/21 150 lb (68 kg)  ?02/03/21 153 lb (69.4 kg)  ? ? ?Patient Active Problem List  ? Diagnosis Date Noted  ? History of skin cancer 06/14/2016  ? Post-menopausal bleeding 06/15/2013  ? Breast lipoma 06/15/2013  ? ? ?Past Medical History:  ?Diagnosis Date  ? Cancer [facility])   ? basal and squamous cell  ? Chicken pox   ? Complication of anesthesia 1994  ? tubal ligation slow to awaken and bp dropped went home after surgery  ? Cough 08/25/2020  ? last 2 weeks spoke with med md telehealth 08-23-2020 on azithromycin 250 qd x 5 days, nonproductive cough  ? COVID 05/2019  ? headache and faigue x 4 -5 days all symptoms resolved  ? GERD  (gastroesophageal reflux disease)   ? no current meds taken  ? H/O seasonal allergies   ? Heart palpitations occ since sept 2021  ? monitor done nov 2021 results not back yet  ? Migraines   ? none in several yrs  ? PMB (postmenopausal bleeding) 07/2020  ? Wears glasses   ? ? ?Past Surgical History:  ?Procedure Laterality Date  ? BASAL CELL CARCINOMA EXCISION    ? jan 2021 mohs procedure lip mohs also done 8- 10 yrs ago on lip on other side  ? DILITATION & CURRETTAGE/HYSTROSCOPY WITH NOVASURE ABLATION N/A 08/30/2020  ? Procedure: DILATATION & CURETTAGE/HYSTEROSCOPY WITH NOVASURE ABLATION; POLYPECTOMY WITH MYOSURE AND PERICERVICAL BLOCK;  Surgeon: Lavonia Drafts, MD;  Location: [facility];  Service: Gynecology;  Laterality: N/A;  ? TUBAL LIGATION  1994  ? WISDOM TOOTH EXTRACTION  yrs ago  ? ? ?Social History  ? ?Tobacco Use  ? Smoking status: Never  ? Smokeless tobacco: Never  ?Vaping Use  ? Vaping Use: Never used  ?Substance Use Topics  ? Alcohol use: Yes  ?  Alcohol/week: 0.0 standard drinks  ?  Comment: rare  ? Drug use: No  ? ? ?Family History  ?Problem Relation Age of Onset  ? Parkinson's disease Mother   ? Heart attack Father   ? Cancer Paternal Grandfather   ?  skin  ? Cancer Maternal Grandmother   ?     skin  ? Hypertension Other   ? Hyperlipidemia Other   ? ? ?No Known Allergies ? ?Medication list has been reviewed and updated. ? ?Current Outpatient Medications on File Prior to Visit  ?Medication Sig Dispense Refill  ? cetirizine (ZYRTEC) 10 MG tablet Take 10 mg by mouth at bedtime.    ? Cholecalciferol (VITAMIN D3) 5000 units TABS Take 1 tablet by mouth every other day.    ? estradiol (VIVELLE-DOT) 0.05 MG/24HR patch PLACE 1 PATCH ON SKIN TWICE WEEKLY 24 patch 6  ? medroxyPROGESTERone (PROVERA) 2.5 MG tablet Take 1 tablet (2.5 mg total) by mouth daily. Days 1-5 of the month. 45 tablet 4  ? Multiple Vitamin (MULTIVITAMIN) tablet Take 1 tablet by mouth daily.    ? ?No current  facility-administered medications on file prior to visit.  ? ? ?Review of Systems: ? ?As per HPI- otherwise negative. ? ? ?Physical Examination: ?Vitals:  ? 01/10/22 0939  ?BP: 122/70  ?Pulse: (!) 55  ?Resp: 18  ?Temp: 97.7 ?F (36.5 ?C)  ?SpO2: 96%  ? ?Vitals:  ? 01/10/22 0939  ?Weight: 155 lb 6.4 oz (70.5 kg)  ?Height: '5\' 5"'$  (1.651 m)  ? ?Body mass index is 25.86 kg/m?. ?Ideal Body Weight: Weight in (lb) to have BMI = 25: 149.9 ? ?GEN: no acute distress.  Minimal overweight, looks well and younger than age  ?HEENT: Atraumatic, Normocephalic.  Bilateral TM wnl, oropharynx normal.  PEERL,EOMI.   ?Ears and Nose: No external deformity. ?CV: RRR, No M/G/R. No JVD. No thrill. No extra heart sounds. ?PULM: CTA B, no wheezes, crackles, rhonchi. No retractions. No resp. distress. No accessory muscle use. ?ABD: S, NT, ND, +BS. No rebound. No HSM. ?EXTR: No c/c/e ?PSYCH: Normally interactive. Conversant.  ? ?Waist 31.5 in ?Assessment and Plan: ?Physical exam ? ?Screening for hyperlipidemia - Plan: Lipid panel ? ?Screening for thyroid disorder - Plan: TSH ? ?Screening for diabetes mellitus - Plan: Comprehensive metabolic panel, Hemoglobin A1c ? ?Fatigue, unspecified type - Plan: CBC, TSH ? ?Screening for colon cancer - Plan: Cologuard ? ?Leg cramps - Plan: Magnesium ? ?Vitamin D deficiency - Plan: VITAMIN D 25 Hydroxy (Vit-D Deficiency, Fractures) ? ?Physical exam today.  Encouraged healthy diet and exercise routine ?Will plan further follow- up pending labs. ?Discussed home remedies for foot cramps ?Pt has insurance form which needs to be completed once labs are in  ? ?Signed ?Lamar Blinks, MD ? ?Received labs as below, message to patient.  Completed her insurance form ? ?Results for orders placed or performed in visit on 01/10/22  ?CBC  ?Result Value Ref Range  ? WBC 7.4 4.0 - 10.5 K/uL  ? RBC 4.79 3.87 - 5.11 Mil/uL  ? Platelets 269.0 150.0 - 400.0 K/uL  ? Hemoglobin 14.0 12.0 - 15.0 g/dL  ? HCT 42.0 36.0 - 46.0 %  ? MCV  87.7 78.0 - 100.0 fl  ? MCHC 33.3 30.0 - 36.0 g/dL  ? RDW 13.5 11.5 - 15.5 %  ?Comprehensive metabolic panel  ?Result Value Ref Range  ? Sodium 139 135 - 145 mEq/L  ? Potassium 4.4 3.5 - 5.1 mEq/L  ? Chloride 103 96 - 112 mEq/L  ? CO2 30 19 - 32 mEq/L  ? Glucose, Bld 85 70 - 99 mg/dL  ? BUN 13 6 - 23 mg/dL  ? Creatinine, Ser 0.96 0.40 - 1.20 mg/dL  ? Total Bilirubin 0.7 0.2 - 1.2 mg/dL  ?  Alkaline Phosphatase 83 39 - 117 U/L  ? AST 12 0 - 37 U/L  ? ALT 9 0 - 35 U/L  ? Total Protein 6.5 6.0 - 8.3 g/dL  ? Albumin 4.3 3.5 - 5.2 g/dL  ? GFR 63.01 >60.00 mL/min  ? Calcium 9.3 8.4 - 10.5 mg/dL  ?Hemoglobin A1c  ?Result Value Ref Range  ? Hgb A1c MFr Bld 5.2 4.6 - 6.5 %  ?Lipid panel  ?Result Value Ref Range  ? Cholesterol 195 0 - 200 mg/dL  ? Triglycerides 115.0 0.0 - 149.0 mg/dL  ? HDL 50.50 >39.00 mg/dL  ? VLDL 23.0 0.0 - 40.0 mg/dL  ? LDL Cholesterol 121 (H) 0 - 99 mg/dL  ? Total CHOL/HDL Ratio 4   ? NonHDL 144.43   ?TSH  ?Result Value Ref Range  ? TSH 1.27 0.35 - 5.50 uIU/mL  ?VITAMIN D 25 Hydroxy (Vit-D Deficiency, Fractures)  ?Result Value Ref Range  ? VITD 52.17 30.00 - 100.00 ng/mL  ?Magnesium  ?Result Value Ref Range  ? Magnesium 2.1 1.5 - 2.5 mg/dL  ? ? ? ?

## 2022-01-10 ENCOUNTER — Encounter: Payer: Self-pay | Admitting: Family Medicine

## 2022-01-10 ENCOUNTER — Ambulatory Visit (INDEPENDENT_AMBULATORY_CARE_PROVIDER_SITE_OTHER): Payer: Commercial Managed Care - PPO | Admitting: Family Medicine

## 2022-01-10 VITALS — BP 122/70 | HR 55 | Temp 97.7°F | Resp 18 | Ht 65.0 in | Wt 155.4 lb

## 2022-01-10 DIAGNOSIS — Z0000 Encounter for general adult medical examination without abnormal findings: Secondary | ICD-10-CM

## 2022-01-10 DIAGNOSIS — E559 Vitamin D deficiency, unspecified: Secondary | ICD-10-CM | POA: Diagnosis not present

## 2022-01-10 DIAGNOSIS — Z131 Encounter for screening for diabetes mellitus: Secondary | ICD-10-CM | POA: Diagnosis not present

## 2022-01-10 DIAGNOSIS — Z13220 Encounter for screening for lipoid disorders: Secondary | ICD-10-CM

## 2022-01-10 DIAGNOSIS — R252 Cramp and spasm: Secondary | ICD-10-CM | POA: Diagnosis not present

## 2022-01-10 DIAGNOSIS — Z1211 Encounter for screening for malignant neoplasm of colon: Secondary | ICD-10-CM

## 2022-01-10 DIAGNOSIS — R5383 Other fatigue: Secondary | ICD-10-CM | POA: Diagnosis not present

## 2022-01-10 DIAGNOSIS — Z1329 Encounter for screening for other suspected endocrine disorder: Secondary | ICD-10-CM

## 2022-01-10 LAB — COMPREHENSIVE METABOLIC PANEL
ALT: 9 U/L (ref 0–35)
AST: 12 U/L (ref 0–37)
Albumin: 4.3 g/dL (ref 3.5–5.2)
Alkaline Phosphatase: 83 U/L (ref 39–117)
BUN: 13 mg/dL (ref 6–23)
CO2: 30 mEq/L (ref 19–32)
Calcium: 9.3 mg/dL (ref 8.4–10.5)
Chloride: 103 mEq/L (ref 96–112)
Creatinine, Ser: 0.96 mg/dL (ref 0.40–1.20)
GFR: 63.01 mL/min (ref >60.00)
Glucose, Bld: 85 mg/dL (ref 70–99)
Potassium: 4.4 mEq/L (ref 3.5–5.1)
Sodium: 139 mEq/L (ref 135–145)
Total Bilirubin: 0.7 mg/dL (ref 0.2–1.2)
Total Protein: 6.5 g/dL (ref 6.0–8.3)

## 2022-01-10 LAB — CBC
HCT: 42 % (ref 36.0–46.0)
Hemoglobin: 14 g/dL (ref 12.0–15.0)
MCHC: 33.3 g/dL (ref 30.0–36.0)
MCV: 87.7 fl (ref 78.0–100.0)
Platelets: 269 10*3/uL (ref 150.0–400.0)
RBC: 4.79 Mil/uL (ref 3.87–5.11)
RDW: 13.5 % (ref 11.5–15.5)
WBC: 7.4 10*3/uL (ref 4.0–10.5)

## 2022-01-10 LAB — TSH: TSH: 1.27 u[IU]/mL (ref 0.35–5.50)

## 2022-01-10 LAB — LIPID PANEL
Cholesterol: 195 mg/dL (ref 0–200)
HDL: 50.5 mg/dL (ref >39.00)
LDL Cholesterol: 121 mg/dL — ABNORMAL HIGH (ref 0–99)
NonHDL: 144.43
Total CHOL/HDL Ratio: 4
Triglycerides: 115 mg/dL (ref 0.0–149.0)
VLDL: 23 mg/dL (ref 0.0–40.0)

## 2022-01-10 LAB — HEMOGLOBIN A1C: Hgb A1c MFr Bld: 5.2 % (ref 4.6–6.5)

## 2022-01-10 LAB — MAGNESIUM: Magnesium: 2.1 mg/dL (ref 1.5–2.5)

## 2022-01-10 LAB — VITAMIN D 25 HYDROXY (VIT D DEFICIENCY, FRACTURES): VITD: 52.17 ng/mL (ref 30.00–100.00)

## 2022-01-23 ENCOUNTER — Other Ambulatory Visit (HOSPITAL_BASED_OUTPATIENT_CLINIC_OR_DEPARTMENT_OTHER): Payer: Self-pay

## 2022-01-23 ENCOUNTER — Encounter (HOSPITAL_BASED_OUTPATIENT_CLINIC_OR_DEPARTMENT_OTHER): Payer: Self-pay

## 2022-01-23 ENCOUNTER — Ambulatory Visit (HOSPITAL_BASED_OUTPATIENT_CLINIC_OR_DEPARTMENT_OTHER)
Admission: RE | Admit: 2022-01-23 | Discharge: 2022-01-23 | Disposition: A | Discharge status: Home / Self Care | Payer: Commercial Managed Care - PPO | Source: Ambulatory Visit | Attending: Obstetrics & Gynecology | Admitting: Obstetrics & Gynecology

## 2022-01-23 DIAGNOSIS — Z1231 Encounter for screening mammogram for malignant neoplasm of breast: Secondary | ICD-10-CM | POA: Diagnosis present

## 2022-01-25 ENCOUNTER — Other Ambulatory Visit: Payer: Self-pay | Admitting: Obstetrics & Gynecology

## 2022-01-25 DIAGNOSIS — R928 Other abnormal and inconclusive findings on diagnostic imaging of breast: Secondary | ICD-10-CM

## 2022-01-30 ENCOUNTER — Ambulatory Visit
Admission: RE | Admit: 2022-01-30 | Discharge: 2022-01-30 | Disposition: A | Discharge status: Home / Self Care | Payer: Commercial Managed Care - PPO | Source: Ambulatory Visit | Attending: Obstetrics & Gynecology | Admitting: Obstetrics & Gynecology

## 2022-01-30 ENCOUNTER — Encounter: Payer: Self-pay | Admitting: Family Medicine

## 2022-01-30 DIAGNOSIS — R928 Other abnormal and inconclusive findings on diagnostic imaging of breast: Secondary | ICD-10-CM

## 2022-01-30 LAB — COLOGUARD: COLOGUARD: NEGATIVE

## 2022-02-06 ENCOUNTER — Other Ambulatory Visit: Payer: Commercial Managed Care - PPO

## 2022-02-13 ENCOUNTER — Other Ambulatory Visit (HOSPITAL_BASED_OUTPATIENT_CLINIC_OR_DEPARTMENT_OTHER): Payer: Self-pay

## 2022-02-26 ENCOUNTER — Encounter: Payer: Self-pay | Admitting: Family Medicine

## 2022-02-26 ENCOUNTER — Ambulatory Visit: Payer: Commercial Managed Care - PPO | Attending: Family Medicine | Admitting: Physical Therapy

## 2022-02-26 DIAGNOSIS — R42 Dizziness and giddiness: Secondary | ICD-10-CM

## 2022-02-26 DIAGNOSIS — H8111 Benign paroxysmal vertigo, right ear: Secondary | ICD-10-CM | POA: Insufficient documentation

## 2022-02-26 NOTE — Addendum Note (Signed)
Addended by: Malachy Moan L on: 02/26/2022 03:36 PM   Modules accepted: Orders

## 2022-02-26 NOTE — Therapy (Signed)
[place], [place], [address] Phone: [phone]   Fax:  [phone]  Physical Therapy Evaluation  Patient Details  Name: Krista Schwartz MRN: [id number] Date of Birth: Jul 26, 1956 Referring Provider (PT): Krista Schwartz, Krista Apa, DO  Rationale for Evaluation and Treatment Rehabilitation   Encounter Date: 02/26/2022   PT End of Session - 02/26/22 1528     Visit Number 1    Number of Visits 6    Date for PT Re-Evaluation 04/09/22    Authorization Type UHC    PT Start Time 3845    PT Stop Time 1525    PT Time Calculation (min) 40 min    Activity Tolerance Patient tolerated treatment well    Behavior During Therapy [facility] for tasks assessed/performed             Past Medical History:  Diagnosis Date   Cancer ([place])    basal and squamous cell   Chicken pox    Complication of anesthesia 1994   tubal ligation slow to awaken and bp dropped went home after surgery   Cough 08/25/2020   last 2 weeks spoke with med md telehealth 08-23-2020 on azithromycin 250 qd x 5 days, nonproductive cough   COVID 05/2019   headache and faigue x 4 -5 days all symptoms resolved   GERD (gastroesophageal reflux disease)    no current meds taken   H/O seasonal allergies    Heart palpitations occ since sept 2021   monitor done nov 2021 results not back yet   Migraines    none in several yrs   PMB (postmenopausal bleeding) 07/2020   Wears glasses     Past Surgical History:  Procedure Laterality Date   BASAL CELL CARCINOMA EXCISION     jan 2021 mohs procedure lip mohs also done 8- 10 yrs ago on lip on other side   DILITATION & CURRETTAGE/HYSTROSCOPY WITH NOVASURE ABLATION N/A 08/30/2020   Procedure: DILATATION & CURETTAGE/HYSTEROSCOPY WITH NOVASURE ABLATION; POLYPECTOMY WITH MYOSURE AND PERICERVICAL BLOCK;  Surgeon: Krista Drafts, MD;  Location: [facility];  Service: Gynecology;   Laterality: N/A;   TUBAL LIGATION  1994   WISDOM TOOTH EXTRACTION  yrs ago    There were no vitals filed for this visit.    Subjective Assessment - 02/26/22 1445     Subjective Pt states she felt dizziness come on Saturday -- it wasn't too bad. However, she woke up Sunday and she couldn't walk a straight line. Pt reports getting up and laying down she is getting a spinning sensation. Pt states she feels flared up today. Currently taking diazapam. Has seen PT in the past for canalith repositioning with good results.    How long can you sit comfortably? n/a    How long can you stand comfortably? n/a    How long can you walk comfortably? n/a    Patient Stated Goals Improve dizziness    Currently in Pain? No/denies                [facility] PT Assessment - 02/26/22 0001       Assessment   Medical Diagnosis Vertigo    Referring Provider (PT) Krista Held, DO    Onset Date/Surgical Date 02/24/22    Prior Therapy For BPPV      Precautions   Precautions Fall      Balance Screen   Has the patient fallen in the past 6 months No  [place] Private residence    Living Arrangements Spouse/significant other      Prior Function   Vocation Full time employment    Education officer, museum; currently on summer vacation      Observation/Other Assessments   Focus on [place] ([place])  48; predicted 61                    Vestibular Assessment - 02/26/22 0001       Symptom Behavior   Subjective history of current problem Tried maneuvers but did not seem to work    Type of Dizziness  Spinning    Frequency of Dizziness With bed mobility    Duration of Dizziness A few seconds    Symptom Nature Motion provoked    Aggravating Factors Supine to sit;Sit to stand;Turning head quickly    Relieving Factors Slow movements;Rest    Progression of Symptoms Better    History of similar episodes ~1 year ago  -- was very nauseous and throwing up. Saw PT.      Positional Testing   Dix-Hallpike Dix-Hallpike Right;Dix-Hallpike Left    Sidelying Test Sidelying Right;Sidelying Left      Dix-Hallpike Right   Dix-Hallpike Right Duration 0    Dix-Hallpike Right Symptoms No nystagmus      Dix-Hallpike Left   Dix-Hallpike Left Duration 0    Dix-Hallpike Left Symptoms No nystagmus      Sidelying Right   Sidelying Right Duration ~35 sec   trial 2: 30 sec   Sidelying Right Symptoms --   Unable to visualize nystagmus     Sidelying Left   Sidelying Left Duration 0                Objective measurements completed on examination: See above findings.        Vestibular Treatment/Exercise - 02/26/22 0001       Vestibular Treatment/Exercise   Vestibular Treatment Provided Canalith Repositioning    Canalith Repositioning Semont Procedure Right Posterior    Gaze Exercises X1 Viewing Horizontal;X1 Viewing Vertical      Semont Procedure Right Posterior   Number of Reps  3    Overall Response  Improved Symptoms      X1 Viewing Horizontal   Foot Position seated    Reps 2    Comments x20 sec      X1 Viewing Vertical   Reps 2    Comments x20 sec                         PT Long Term Goals - 02/26/22 1532       PT LONG TERM GOAL #1   Title Patient to be independent with HEP for dizziness as needed.    Time 6    Period Weeks    Status New    Target Date 04/09/22      PT LONG TERM GOAL #2   Title Pt will be (-) in all canalith testing positions to demo resolution of BPPV    Time 6    Period Weeks    Status New    Target Date 04/09/22      PT LONG TERM GOAL #3   Title Pt will have improved FOTO score to >/= 61    Time 6    Period Weeks    Status New    Target Date 04/09/22      PT LONG  TERM GOAL #4   Title Patient to report no dizziness with bed mobility.    Time 6    Period Weeks    Status New    Target Date 04/09/22                     Plan - 02/26/22 1529     Clinical Impression Statement Ms. Krista Schwartz is a 66 y/o F presenting to OPPT due to new onset of vertigo. Pt has prior history of BPPV ~1 year ago. Has tried Epley maneuver at home but was not successful at clearing the otoconia. On assessment, pt demos (+) symptoms in sidelying Dix-hallpike in R posterior canalith. Unable to visualize nystagmus; however, pt states she took diazapam today. Provided semont maneuver x 3 and gaze stabilization exercises. Pt notes some improvements after canalith repositioning. Pt would benefit from continued PT to address her dizziness for return to PLOF and leisure/vacation.    Personal Factors and Comorbidities Time since onset of injury/illness/exacerbation    Examination-Activity Limitations Bed Mobility;Transfers    Examination-Participation Restrictions Driving;Community Activity    Stability/Clinical Decision Making Stable/Uncomplicated    Clinical Decision Making Low    Rehab Potential Good    PT Frequency 1x / week    PT Duration 6 weeks    PT Treatment/Interventions ADLs/Self Care Home Management;Gait training;Stair training;Functional mobility training;Therapeutic activities;Therapeutic exercise;Balance training;Neuromuscular re-education;Vestibular    PT Next Visit Plan Reassess canaliths and reposition as indicated. Continue gaze stabilization exercises.    PT Home Exercise Plan Access Code: [email or web address]    Consulted and Agree with Plan of Care Patient             Patient will benefit from skilled therapeutic intervention in order to improve the following deficits and impairments:  Dizziness, Decreased balance, Difficulty walking  Visit Diagnosis: BPPV (benign paroxysmal positional vertigo), right  Dizziness and giddiness     Problem List Patient Active Problem List   Diagnosis Date Noted   History of skin cancer 06/14/2016   Post-menopausal bleeding 06/15/2013   Breast lipoma 06/15/2013    [facility] April  Gordy Levan, PT, DPT 02/26/2022, 3:34 PM  [facility] [place], [place], [address] Phone: [phone]   Fax:  [phone]  Name: Krista Schwartz MRN: [id number] Date of Birth: [birth year]

## 2022-02-26 NOTE — Addendum Note (Signed)
Addended by: Jiles Prows on: 02/26/2022 12:03 PM   Modules accepted: Orders

## 2022-02-26 NOTE — Telephone Encounter (Signed)
Ok per Dr. Carollee Herter referral to [facility] [address]  [place], [place], [address] Phone: [phone]   Fax:  [phone] Renewed at patient's request for history of vertigo.

## 2022-02-28 ENCOUNTER — Ambulatory Visit: Payer: Commercial Managed Care - PPO | Admitting: Physical Therapy

## 2022-03-06 ENCOUNTER — Ambulatory Visit: Payer: Commercial Managed Care - PPO | Admitting: Physical Therapy

## 2022-03-14 ENCOUNTER — Ambulatory Visit: Payer: Commercial Managed Care - PPO | Admitting: Obstetrics & Gynecology

## 2022-03-15 ENCOUNTER — Other Ambulatory Visit (HOSPITAL_BASED_OUTPATIENT_CLINIC_OR_DEPARTMENT_OTHER): Payer: Self-pay

## 2022-03-15 ENCOUNTER — Other Ambulatory Visit: Payer: Self-pay

## 2022-03-19 ENCOUNTER — Other Ambulatory Visit (HOSPITAL_BASED_OUTPATIENT_CLINIC_OR_DEPARTMENT_OTHER): Payer: Self-pay

## 2022-04-03 ENCOUNTER — Other Ambulatory Visit (HOSPITAL_BASED_OUTPATIENT_CLINIC_OR_DEPARTMENT_OTHER): Payer: Self-pay

## 2022-04-04 ENCOUNTER — Ambulatory Visit: Payer: Commercial Managed Care - PPO | Admitting: Obstetrics & Gynecology

## 2022-04-11 ENCOUNTER — Ambulatory Visit (INDEPENDENT_AMBULATORY_CARE_PROVIDER_SITE_OTHER): Payer: Commercial Managed Care - PPO | Admitting: Obstetrics & Gynecology

## 2022-04-11 ENCOUNTER — Encounter: Payer: Self-pay | Admitting: Obstetrics & Gynecology

## 2022-04-11 ENCOUNTER — Other Ambulatory Visit (HOSPITAL_COMMUNITY)
Admission: RE | Admit: 2022-04-11 | Discharge: 2022-04-11 | Disposition: A | Discharge status: Home / Self Care | Payer: Commercial Managed Care - PPO | Source: Ambulatory Visit | Attending: Obstetrics & Gynecology | Admitting: Obstetrics & Gynecology

## 2022-04-11 ENCOUNTER — Other Ambulatory Visit (HOSPITAL_BASED_OUTPATIENT_CLINIC_OR_DEPARTMENT_OTHER): Payer: Self-pay

## 2022-04-11 VITALS — BP 131/81 | HR 53 | Ht 65.0 in | Wt 154.0 lb

## 2022-04-11 DIAGNOSIS — Z01419 Encounter for gynecological examination (general) (routine) without abnormal findings: Secondary | ICD-10-CM

## 2022-04-11 DIAGNOSIS — N951 Menopausal and female climacteric states: Secondary | ICD-10-CM

## 2022-04-11 DIAGNOSIS — N858 Other specified noninflammatory disorders of uterus: Secondary | ICD-10-CM

## 2022-04-11 DIAGNOSIS — Z79890 Hormone replacement therapy (postmenopausal): Secondary | ICD-10-CM | POA: Diagnosis not present

## 2022-04-11 DIAGNOSIS — N950 Postmenopausal bleeding: Secondary | ICD-10-CM

## 2022-04-11 MED ORDER — MEDROXYPROGESTERONE ACETATE 2.5 MG PO TABS
2.5000 mg | ORAL_TABLET | Freq: Every day | ORAL | 4 refills | Status: DC
  Filled 2022-04-11: qty 5, 28d supply, fill #0
  Filled 2022-05-16: qty 5, 28d supply, fill #1
  Filled 2022-06-25: qty 5, 28d supply, fill #2
  Filled 2022-07-18: qty 5, 28d supply, fill #3
  Filled 2022-08-15: qty 5, 28d supply, fill #4
  Filled 2022-09-30: qty 5, 28d supply, fill #5
  Filled 2022-10-24: qty 5, 28d supply, fill #6
  Filled 2022-11-21: qty 5, 28d supply, fill #7
  Filled 2022-12-28: qty 5, 28d supply, fill #8
  Filled 2023-01-22: qty 5, 28d supply, fill #9
  Filled 2023-02-19: qty 5, 28d supply, fill #10
  Filled 2023-03-17: qty 5, 28d supply, fill #11

## 2022-04-11 MED ORDER — ESTRADIOL 0.05 MG/24HR TD PTTW
1.0000 | MEDICATED_PATCH | TRANSDERMAL | 6 refills | Status: DC
  Filled 2022-04-11: qty 8, 28d supply, fill #0
  Filled 2022-05-16: qty 8, 28d supply, fill #1

## 2022-04-11 NOTE — Progress Notes (Signed)
Subjective:     Krista Schwartz is a 66 y.o. female here for a routine exam.  Current complaints: none. She reports some light spotting on Progestin withdrawal otherwise no bleeding. Her sx are well controlled on her current dosage of HRT and she desires to cont this regimen.    No risk factors for osteoporissi. Did Cologard this year.    Gynecologic History Patient's last menstrual period was 08/27/2009. Contraception: post menopausal status Last Pap: 06/25/2019. Results were: normal Last mammogram: 01/30/2022. Results were: benign finding.   Obstetric History OB History  Gravida Para Term Preterm AB Living  '3 3 3     3  '$ SAB IAB Ectopic Multiple Live Births               # Outcome Date GA Lbr Len/2nd Weight Sex Delivery Anes PTL Lv  3 Term           2 Term           1 Term            The following portions of the patient's history were reviewed and updated as appropriate: allergies, current medications, past family history, past medical history, past social history, past surgical history, and problem list.  Review of Systems Pertinent items are noted in HPI.    Objective:  BP 131/81   Pulse (!) 53   Ht '5\' 5"'$  (1.651 m)   Wt 154 lb (69.9 kg)   LMP 08/27/2009   BMI 25.63 kg/m  General Appearance:    Alert, cooperative, no distress, appears stated age  Head:    Normocephalic, without obvious abnormality, atraumatic  Eyes:    conjunctiva/corneas clear, EOM's intact, both eyes  Ears:    Normal external ear canals, both ears  Nose:   Nares normal, septum midline, mucosa normal, no drainage    or sinus tenderness  Throat:   Lips, mucosa, and tongue normal; teeth and gums normal  Neck:   Supple, symmetrical, trachea midline, no adenopathy;    thyroid:  no enlargement/tenderness/nodules  Back:     Symmetric, no curvature, ROM normal, no CVA tenderness  Lungs:     respirations unlabored  Chest Wall:    No tenderness or deformity   Heart:    Regular rate and rhythm  Breast  Exam:    No tenderness, masses, or nipple abnormality  Abdomen:     Soft, non-tender, bowel sounds active all four quadrants,    no masses, no organomegaly  Genitalia:    Normal female without lesion, discharge or tenderness     Extremities:   Extremities normal, atraumatic, no cyanosis or edema  Pulses:   2+ and symmetric all extremities  Skin:   Skin color, texture, turgor normal, no rashes or lesions; several tags.      Assessment:    Healthy female exam.    Plan:  Krista Schwartz was seen today for annual exam.  Diagnoses and all orders for this visit:  Well female exam with routine gynecological exam -     Cytology - PAP( [facility])  Hormone replacement therapy (HRT) -     estradiol (VIVELLE-DOT) 0.05 MG/24HR patch; PLACE 1 PATCH ON SKIN TWICE WEEKLY -     medroxyPROGESTERone (PROVERA) 2.5 MG tablet; Take 1 tablet (2.5 mg total) by mouth daily. Days 1-5 of the month.  Menopausal hot flushes -     estradiol (VIVELLE-DOT) 0.05 MG/24HR patch; PLACE 1 PATCH ON SKIN TWICE WEEKLY  Atrophic endometrium -  estradiol (VIVELLE-DOT) 0.05 MG/24HR patch; PLACE 1 PATCH ON SKIN TWICE WEEKLY -     medroxyPROGESTERone (PROVERA) 2.5 MG tablet; Take 1 tablet (2.5 mg total) by mouth daily. Days 1-5 of the month.  Post-menopausal bleeding   F/u in q year or sooner prn   Krista Schwartz L. Harraway-Smith, M.D., [name]

## 2022-04-18 LAB — CYTOLOGY - PAP
Comment: NEGATIVE
Diagnosis: UNDETERMINED — AB
High risk HPV: NEGATIVE

## 2022-05-16 ENCOUNTER — Other Ambulatory Visit (HOSPITAL_BASED_OUTPATIENT_CLINIC_OR_DEPARTMENT_OTHER): Payer: Self-pay

## 2022-06-25 ENCOUNTER — Other Ambulatory Visit (HOSPITAL_BASED_OUTPATIENT_CLINIC_OR_DEPARTMENT_OTHER): Payer: Self-pay

## 2022-07-18 ENCOUNTER — Other Ambulatory Visit (HOSPITAL_BASED_OUTPATIENT_CLINIC_OR_DEPARTMENT_OTHER): Payer: Self-pay

## 2022-10-15 ENCOUNTER — Other Ambulatory Visit (HOSPITAL_BASED_OUTPATIENT_CLINIC_OR_DEPARTMENT_OTHER): Payer: Self-pay

## 2022-10-15 ENCOUNTER — Telehealth: Payer: Self-pay

## 2022-10-15 ENCOUNTER — Ambulatory Visit: Payer: Commercial Managed Care - PPO | Admitting: Family Medicine

## 2022-10-15 ENCOUNTER — Encounter: Payer: Self-pay | Admitting: Family Medicine

## 2022-10-15 VITALS — BP 147/91 | HR 60 | Temp 98.0°F | Resp 16 | Ht 65.0 in | Wt 157.0 lb

## 2022-10-15 DIAGNOSIS — J020 Streptococcal pharyngitis: Secondary | ICD-10-CM

## 2022-10-15 DIAGNOSIS — J029 Acute pharyngitis, unspecified: Secondary | ICD-10-CM | POA: Diagnosis not present

## 2022-10-15 DIAGNOSIS — R6889 Other general symptoms and signs: Secondary | ICD-10-CM

## 2022-10-15 LAB — POCT RAPID STREP A (OFFICE): Rapid Strep A Screen: POSITIVE — AB

## 2022-10-15 LAB — POCT INFLUENZA A/B
Influenza A, POC: NEGATIVE
Influenza B, POC: NEGATIVE

## 2022-10-15 MED ORDER — AMOXICILLIN 500 MG PO CAPS: 500.0000 mg | ORAL_CAPSULE | Freq: Two times a day (BID) | ORAL | 0 refills | Status: DC

## 2022-10-15 MED ORDER — AMOXICILLIN 500 MG PO CAPS
500.0000 mg | ORAL_CAPSULE | Freq: Two times a day (BID) | ORAL | 0 refills | Status: AC
  Filled 2022-10-15: qty 20, 10d supply, fill #0

## 2022-10-15 NOTE — Telephone Encounter (Signed)
Appt today w/ Lovena Le

## 2022-10-15 NOTE — Progress Notes (Signed)
Acute Office Visit  Subjective:     Patient ID: Krista Schwartz, female    DOB: 14-Aug-1956, 67 y.o.   MRN: [id number]  Chief Complaint  Patient presents with   Dizziness   Cough   Sore Throat     Patient is in today for URI symptoms.   Patient reports she has been feeling poorly for the past  days. Symptoms have included occasional cough, sore throat, hoarse voice, lightheadedness, headache, sinus pressure, chills, ear pressure/popping. She has not any chest pain, dyspnea, fevers, sneezing. No known sick contacts, but works at a high school. She had a negative COVID test at home this morning.     All review of systems negative except what is listed in the HPI      Objective:    BP (!) 147/91   Pulse 60   Temp 98 F (36.7 C)   Resp 16   Ht 5' 5"$  (1.651 m)   Wt 157 lb (71.2 kg)   LMP 08/27/2009   SpO2 100%   BMI 26.13 kg/m    Physical Exam Vitals reviewed.  Constitutional:      Appearance: Normal appearance.  HENT:     Head: Normocephalic and atraumatic.     Mouth/Throat:     Pharynx: Posterior oropharyngeal erythema present.     Tonsils: No tonsillar exudate or tonsillar abscesses.  Cardiovascular:     Rate and Rhythm: Normal rate and regular rhythm.     Pulses: Normal pulses.     Heart sounds: Normal heart sounds.  Pulmonary:     Effort: Pulmonary effort is normal.     Breath sounds: Normal breath sounds.  Skin:    General: Skin is warm and dry.  Neurological:     Mental Status: She is alert and oriented to person, place, and time.  Psychiatric:        Mood and Affect: Mood normal.        Behavior: Behavior normal.        Thought Content: Thought content normal.        Judgment: Judgment normal.       Results for orders placed or performed in visit on 10/15/22  POCT Influenza A/B  Result Value Ref Range   Influenza A, POC Negative Negative   Influenza B, POC Negative Negative  POCT rapid strep A  Result Value Ref Range   Rapid Strep  A Screen Positive (A) Negative        Assessment & Plan:   Problem List Items Addressed This Visit   None Visit Diagnoses     Flu-like symptoms    -  Primary   Relevant Orders   POCT Influenza A/B (Completed)   Sore throat       Relevant Orders   POCT rapid strep A (Completed)   Strep pharyngitis       Relevant Medications   amoxicillin (AMOXIL) 500 MG capsule     Flu negative Strep positive - starting Amoxicillin. Continue supportive measures including rest, hydration, humidifier use, steam showers, warm compresses to sinuses, warm liquids with lemon and honey, and over-the-counter cough, cold, and analgesics as needed.   Please contact office for follow-up if symptoms do not improve or worsen. Seek emergency care if symptoms become severe.  BP is elevated, likely because you aren't feeling well. Please monitor at home and let us know if consistently elevated.       Meds ordered this encounter  Medications   DISCONTD: amoxicillin (  AMOXIL) 500 MG capsule    Sig: Take 1 capsule (500 mg total) by mouth 2 (two) times daily for 10 days.    Dispense:  20 capsule    Refill:  0    Order Specific Question:   Supervising Provider    Answer:   Penni Homans A [[id number]]   amoxicillin (AMOXIL) 500 MG capsule    Sig: Take 1 capsule (500 mg total) by mouth 2 (two) times daily for 10 days.    Dispense:  20 capsule    Refill:  0    Order Specific Question:   Supervising Provider    Answer:   Penni Homans A [id number]    Return if symptoms worsen or fail to improve.  Terrilyn Saver, NP

## 2022-10-15 NOTE — Telephone Encounter (Signed)
Initial Comment Caller states she has an appointment . She has no voice, cough, sore throat, and light headedness Translation No Nurse Assessment Nurse: Curlene Labrum, RN, [place] Date/Time (Eastern Time): 10/15/2022 8:19:20 AM Confirm and document reason for call. If symptomatic, describe symptoms. ---She has no voice, cough, sore throat, congested, and has vertigo. Onset Thursday. Does the patient have any new or worsening symptoms? ---Yes Will a triage be completed? ---Yes Related visit to physician within the last 2 weeks? ---No Does the PT have any chronic conditions? (i.e. diabetes, asthma, this includes High risk factors for pregnancy, etc.) ---No Is this a behavioral health or substance abuse call? ---No Guidelines Guideline Title Affirmed Question Affirmed Notes Nurse Date/Time (Eastern Time) Cough - Acute Productive Cough with cold symptoms (e.g., runny nose, postnasal drip, throat clearing) Belac, RN, [place] 10/15/2022 8:21:11 AM Disp. Time Eilene Ghazi Time) Disposition Final User 10/15/2022 8:27:31 AM Home Care Yes Belac, RN, [place] Final Disposition 10/15/2022 8:27:31 AM Home Care Yes Belac, RN, [place] PLEASE NOTE: All timestamps contained within this report are represented as [place] Standard Time. CONFIDENTIALTY NOTICE: This fax transmission is intended only for the addressee. It contains information that is legally privileged, confidential or otherwise protected from use or disclosure. If you are not the intended recipient, you are strictly prohibited from reviewing, disclosing, copying using or disseminating any of this information or taking any action in reliance on or regarding this information. If you have received this fax in error, please notify us immediately by telephone so that we can arrange for its return to [place]. Phone: [phone], Toll-Free: [phone], Fax: [phone] Page: 2 of 2 Call Id: [id number] [place] Disagree/Comply Comply Caller Understands  Yes PreDisposition Did not know what to do Care Advice Given Per Guideline HOME CARE: * You should be able to treat this at home. * COUGH DROPS: Over-the-counter cough drops can help a lot, especially for mild coughs. They soothe an irritated throat and remove the tickle sensation in the back of the throat. Cough drops are easy to carry with you. * HOME REMEDY - HONEY: This old home remedy has been shown to help decrease coughing at night. The adult dosage is 2 teaspoons (10 ml) at bedtime. * For fevers above 101 F (38.3 C) take either acetaminophen or ibuprofen. CALL BACK IF: * Fever lasts over 3 days * Nasal discharge lasts over 10 days * Earache or facial pain develops * You become worse CARE ADVICE given per Cough - Acute Productive (Adult) guideline

## 2022-10-15 NOTE — Patient Instructions (Addendum)
Flu negative Strep positive - starting Amoxicillin. Continue supportive measures including rest, hydration, humidifier use, steam showers, warm compresses to sinuses, warm liquids with lemon and honey, and over-the-counter cough, cold, and analgesics as needed.   Please contact office for follow-up if symptoms do not improve or worsen. Seek emergency care if symptoms become severe.

## 2022-10-16 ENCOUNTER — Other Ambulatory Visit (HOSPITAL_BASED_OUTPATIENT_CLINIC_OR_DEPARTMENT_OTHER): Payer: Self-pay

## 2022-10-16 ENCOUNTER — Other Ambulatory Visit: Payer: Self-pay

## 2022-10-16 MED ORDER — FLUCONAZOLE 150 MG PO TABS
150.0000 mg | ORAL_TABLET | Freq: Once | ORAL | 1 refills | Status: AC
  Filled 2022-10-16: qty 1, 1d supply, fill #0

## 2022-10-16 NOTE — Progress Notes (Signed)
Patient request yeast infection medication due to taking antibiotic (prescribed by primary care). ,Kathrene Alu RN

## 2022-11-08 ENCOUNTER — Encounter: Payer: Self-pay | Admitting: Family Medicine

## 2022-11-08 DIAGNOSIS — R42 Dizziness and giddiness: Secondary | ICD-10-CM

## 2022-11-08 DIAGNOSIS — J020 Streptococcal pharyngitis: Secondary | ICD-10-CM

## 2022-11-09 ENCOUNTER — Ambulatory Visit: Payer: Commercial Managed Care - PPO | Admitting: Family Medicine

## 2022-11-14 ENCOUNTER — Ambulatory Visit
Payer: Commercial Managed Care - PPO | Attending: Family Medicine | Admitting: Rehabilitative and Restorative Service Providers"

## 2022-11-14 ENCOUNTER — Other Ambulatory Visit: Payer: Self-pay

## 2022-11-14 DIAGNOSIS — R42 Dizziness and giddiness: Secondary | ICD-10-CM

## 2022-11-14 DIAGNOSIS — H8111 Benign paroxysmal vertigo, right ear: Secondary | ICD-10-CM | POA: Diagnosis not present

## 2022-11-14 NOTE — Therapy (Signed)
OUTPATIENT PHYSICAL THERAPY VESTIBULAR EVALUATION   Patient Name: Krista Schwartz MRN: [id number] DOB:[birth year], 67 y.o., female Today's Date: 11/14/2022  END OF SESSION:  PT End of Session - 11/14/22 1517     Visit Number 1    Number of Visits 8    Date for PT Re-Evaluation 12/14/22    Authorization Type UHC    PT Start Time 0933    PT Stop Time [id number]    PT Time Calculation (min) 42 min    Activity Tolerance Patient tolerated treatment well    Behavior During Therapy [facility] for tasks assessed/performed             Past Medical History:  Diagnosis Date   Cancer ([place])    basal and squamous cell   Chicken pox    Complication of anesthesia 1994   tubal ligation slow to awaken and bp dropped went home after surgery   Cough 08/25/2020   last 2 weeks spoke with med md telehealth 08-23-2020 on azithromycin 250 qd x 5 days, nonproductive cough   COVID 05/2019   headache and faigue x 4 -5 days all symptoms resolved   GERD (gastroesophageal reflux disease)    no current meds taken   H/O seasonal allergies    Heart palpitations occ since sept 2021   monitor done nov 2021 results not back yet   Migraines    none in several yrs   PMB (postmenopausal bleeding) 07/2020   Wears glasses    Past Surgical History:  Procedure Laterality Date   BASAL CELL CARCINOMA EXCISION     jan 2021 mohs procedure lip mohs also done 8- 10 yrs ago on lip on other side   DILITATION & CURRETTAGE/HYSTROSCOPY WITH NOVASURE ABLATION N/A 08/30/2020   Procedure: DILATATION & CURETTAGE/HYSTEROSCOPY WITH NOVASURE ABLATION; POLYPECTOMY WITH MYOSURE AND PERICERVICAL BLOCK;  Surgeon: Lavonia Drafts, MD;  Location: [place];  Service: Gynecology;  Laterality: N/A;   TUBAL LIGATION  1994   WISDOM TOOTH EXTRACTION  yrs ago   Patient Active Problem List   Diagnosis Date Noted   History of skin cancer 06/14/2016   Post-menopausal bleeding 06/15/2013   Breast lipoma 06/15/2013     PCP: Lamar Blinks, MD REFERRING PROVIDER: Lamar Blinks, MD REFERRING DIAG: vertigo THERAPY DIAG:  BPPV (benign paroxysmal positional vertigo), right  Dizziness and giddiness  ONSET DATE: 11/08/22  Rationale for Evaluation and Treatment: Rehabilitation  SUBJECTIVE:   SUBJECTIVE STATEMENT: The patient reports h/o episodic spells of vertigo. She has had prior therapy for vertigo and it improved. She recently began waking up dizzy. She tried doing the home epley's maneuver and home VOR. She reports nothing seems to be working this time. She notes she has to stay upright in the morning because of sensation of dizziness. She notes her balance is off in the morning. She feels that lying on her R side is worse.  Pt accompanied by: self  PERTINENT HISTORY: h/o migraines (none in a long time)  PAIN:  Are you having pain? No  PRECAUTIONS: None  WEIGHT BEARING RESTRICTIONS: No  FALLS: Has patient fallen in last 6 months? No  LIVING ENVIRONMENT: Lives with: lives with their spouse Lives in: House/apartment Stairs: Yes: Internal: 12-14 steps; one rail Occupation: She works as a Tax adviser   PLOF: Independent  PATIENT GOALS: Resolution of dizziness.  OBJECTIVE:   POSTURE:  WNLs  Cervical ROM:    Active A/PROM (deg) eval  Flexion WFLs  Extension [employer]  Right lateral flexion WFLs  Left lateral flexion WFLs  Right rotation WFLs  Left rotation WFLs   PATIENT SURVEYS:  FOTO not completed  VESTIBULAR ASSESSMENT:  GENERAL OBSERVATION: Walks independently without a device.   SYMPTOM BEHAVIOR:  Subjective history: sensation of dizziness worse in morning and with being on her R side  Non-Vestibular symptoms:  none  Type of dizziness: Imbalance (Disequilibrium), Spinning/Vertigo, and Unsteady with head/body turns  Frequency: daily   Duration: seconds to minutes  Aggravating factors: Induced by position change: rolling to the right  Relieving  factors: head stationary and lying supine  Progression of symptoms: worse  OCULOMOTOR EXAM:  Ocular Alignment: normal *do notice mild L head tilt with R eye hypertropia  Ocular ROM: No Limitations  Spontaneous Nystagmus: absent  Gaze-Induced Nystagmus: absent  Smooth Pursuits: intact  Saccades: intact  Other: wears progressive lenses during the day  VESTIBULAR - OCULAR REFLEX:   Slow VOR: Comment: provokes 2-3/10 dizziness, hard to maintain gaze  Head-Impulse Test: HIT Right: positive HIT Left: positive    POSITIONAL TESTING: Right Dix-Hallpike: upbeating, right nystagmus Left Dix-Hallpike: no nystagmus Right Roll Test: positive, however for upbeating, rotary  Left Roll Test: negative  [facility] Adult PT Treatment:                                                DATE: 11/14/22 Canalith Repositioning:  Epley Right: Number of Reps: 2  PATIENT EDUCATION: Education details: nature of condition, HEP Person educated: Patient Education method: Veterinary surgeon Education comprehension: verbalized understanding,  HOME EXERCISE PROGRAM:   Access Code: [email or web address] URL: [email or web address] Date: 11/14/2022 Prepared by: Rudell Cobb  Exercises - Seated Gaze Stabilization with Head Rotation  - 2-3 x daily - 7 x weekly - 1 sets - 2 reps - 30 seconds hold  GOALS: Goals reviewed with patient? Yes   LONG TERM GOALS: Target date: 12/12/22  The patient will be indep with HEP for gaze adaptation and self treatment of BPPV. Baseline: initiated at eval Goal status: INITIAL  2.  The patient will have negative positional testing indicating resolution of BPPV. Baseline: Positive R BPPV posterior canalithiasis Goal status: INITIAL  3.  The patient will be further assessed on foam with eyes closed. Baseline:  to be assessed Goal status: INITIAL  4.  The patient will subjectively report no vertigo with bed mobility. Baseline: dizziness with R rolling Goal status:  INITIAL  5.  The patient will note improvement in balance during morning time. Baseline: Notes unsteadiness worse in AM. Goal status: INITIAL  ASSESSMENT:  CLINICAL IMPRESSION: Patient is a 66 y.o. female who was seen today for physical therapy evaluation and treatment for vertigo. Evaluation findings consistent with R BPPV and impaired VOR per positive head impulse test. Patient has + positional testing in both R dix hallpike and R horizontal roll, although the direction of nystagmus remains upbeating, left indicating all posterior canal involvement. PT treated today with epley's and plans to reassess next session.   OBJECTIVE IMPAIRMENTS: decreased balance and dizziness.   ACTIVITY LIMITATIONS: bending and bed mobility  PARTICIPATION LIMITATIONS: community activity  PERSONAL FACTORS: 1 comorbidity: h/o vertigo  are also affecting patient's functional outcome.   REHAB POTENTIAL: Good  CLINICAL DECISION MAKING: Stable/uncomplicated  EVALUATION COMPLEXITY: Low   PLAN:  PT FREQUENCY: 1x/week  PT DURATION: 4 weeks  PLANNED INTERVENTIONS: Therapeutic  exercises, Therapeutic activity, Neuromuscular re-education, Balance training, Gait training, Patient/Family education, Self Care, and Canalith repositioning  PLAN FOR NEXT SESSION: recheck positional testing, check gaze and progress to standing, check foam with eyes closed balance, and add HEP as indicated. May benefit from dynamic gait as well.    [place], PT 11/14/2022, 3:18 PM

## 2022-11-16 ENCOUNTER — Ambulatory Visit: Payer: Commercial Managed Care - PPO | Admitting: Rehabilitative and Restorative Service Providers"

## 2022-11-20 ENCOUNTER — Ambulatory Visit: Payer: Commercial Managed Care - PPO

## 2022-11-20 DIAGNOSIS — H8111 Benign paroxysmal vertigo, right ear: Secondary | ICD-10-CM | POA: Diagnosis not present

## 2022-11-20 DIAGNOSIS — R42 Dizziness and giddiness: Secondary | ICD-10-CM

## 2022-11-20 NOTE — Therapy (Signed)
OUTPATIENT PHYSICAL THERAPY VESTIBULAR EVALUATION   Patient Name: Krista Schwartz MRN: [id number] DOB:06-09-1956, 67 y.o., female Today's Date: 11/20/2022  END OF SESSION:  PT End of Session - 11/20/22 1101     Visit Number 2    Number of Visits 8    Date for PT Re-Evaluation 12/14/22    Authorization Type UHC    PT Start Time 1102    PT Stop Time 1143    PT Time Calculation (min) 41 min    Activity Tolerance Patient tolerated treatment well    Behavior During Therapy WFL for tasks assessed/performed             Past Medical History:  Diagnosis Date   Cancer ([place])    basal and squamous cell   Chicken pox    Complication of anesthesia 1994   tubal ligation slow to awaken and bp dropped went home after surgery   Cough 08/25/2020   last 2 weeks spoke with med md telehealth 08-23-2020 on azithromycin 250 qd x 5 days, nonproductive cough   COVID 05/2019   headache and faigue x 4 -5 days all symptoms resolved   GERD (gastroesophageal reflux disease)    no current meds taken   H/O seasonal allergies    Heart palpitations occ since sept 2021   monitor done nov 2021 results not back yet   Migraines    none in several yrs   PMB (postmenopausal bleeding) 07/2020   Wears glasses    Past Surgical History:  Procedure Laterality Date   BASAL CELL CARCINOMA EXCISION     jan 2021 mohs procedure lip mohs also done 8- 10 yrs ago on lip on other side   DILITATION & CURRETTAGE/HYSTROSCOPY WITH NOVASURE ABLATION N/A 08/30/2020   Procedure: DILATATION & CURETTAGE/HYSTEROSCOPY WITH NOVASURE ABLATION; POLYPECTOMY WITH MYOSURE AND PERICERVICAL BLOCK;  Surgeon: Lavonia Drafts, MD;  Location: [place];  Service: Gynecology;  Laterality: N/A;   TUBAL LIGATION  1994   WISDOM TOOTH EXTRACTION  yrs ago   Patient Active Problem List   Diagnosis Date Noted   History of skin cancer 06/14/2016   Post-menopausal bleeding 06/15/2013   Breast lipoma 06/15/2013     PCP: Lamar Blinks, MD REFERRING PROVIDER: Lamar Blinks, MD REFERRING DIAG: vertigo THERAPY DIAG:  BPPV (benign paroxysmal positional vertigo), right  Dizziness and giddiness  ONSET DATE: 11/08/22  Rationale for Evaluation and Treatment: Rehabilitation  SUBJECTIVE:   SUBJECTIVE STATEMENT: Patient reports she felt better on Thursday but Friday she felt bad, states she wasn't able to turn her head quickly and had increased nausea. Patient states her symptoms cleared up over the weekend. Patient reports no dizziness today. Patient states she was able to roll to her R and sleep on her R side with no exacerbation of symptoms. Pt accompanied by: self  PERTINENT HISTORY: h/o migraines (none in a long time)  PAIN:  Are you having pain? No  PRECAUTIONS: None  WEIGHT BEARING RESTRICTIONS: No  FALLS: Has patient fallen in last 6 months? No  LIVING ENVIRONMENT: Lives with: lives with their spouse Lives in: House/apartment Stairs: Yes: Internal: 12-14 steps; one rail Occupation: She works as a Tax adviser   PLOF: Independent  PATIENT GOALS: Resolution of dizziness.  OBJECTIVE:   POSTURE:  WNLs  Cervical ROM:    Active A/PROM (deg) eval  Flexion WFLs  Extension WFLs  Right lateral flexion WFLs  Left lateral flexion WFLs  Right rotation WFLs  Left rotation [facility]  PATIENT SURVEYS:  FOTO not completed  VESTIBULAR ASSESSMENT:  GENERAL OBSERVATION: Walks independently without a device.   SYMPTOM BEHAVIOR:  Subjective history: sensation of dizziness worse in morning and with being on her R side  Non-Vestibular symptoms:  none  Type of dizziness: Imbalance (Disequilibrium), Spinning/Vertigo, and Unsteady with head/body turns  Frequency: daily   Duration: seconds to minutes  Aggravating factors: Induced by position change: rolling to the right  Relieving factors: head stationary and lying supine  Progression of symptoms:  worse  OCULOMOTOR EXAM:  Ocular Alignment: normal *do notice mild L head tilt with R eye hypertropia  Ocular ROM: No Limitations  Spontaneous Nystagmus: absent  Gaze-Induced Nystagmus: absent  Smooth Pursuits: intact  Saccades: intact  Other: wears progressive lenses during the day  VESTIBULAR - OCULAR REFLEX:   Slow VOR: Comment: provokes 2-3/10 dizziness, hard to maintain gaze  Head-Impulse Test: HIT Right: positive HIT Left: positive    POSITIONAL TESTING: Right Dix-Hallpike: upbeating, right nystagmus Left Dix-Hallpike: no nystagmus Right Roll Test: positive, however for upbeating, rotary  Left Roll Test: negative   [facility] Adult PT Treatment:                                                DATE: 11/20/2022 Neuromuscular re-ed: Dix-Hallpike (-) B Supine Roll test (-) B Fwd amb VORx1 Y/P far target (fast pace) Fwd amb VORc Y/P (fast pace) Airex: mREC static balace x30" --> added slow head turns Y/P 4x10" each x 2 rounds REC static balace x30" --> added slow head turns Y/P 4x10" each Reading eye chart with head shake Habituation: R side rolling sitting <-->supine   [facility] Adult PT Treatment:                                                DATE: 11/14/22 Canalith Repositioning:  Epley Right: Number of Reps: 2  PATIENT EDUCATION: Education details: nature of condition, HEP Person educated: Patient Education method: Veterinary surgeon Education comprehension: verbalized understanding,  HOME EXERCISE PROGRAM:   Access Code: [email or web address] URL: [email or web address] Date: 11/20/2022 Prepared by: Helane Gunther  Exercises - Seated Gaze Stabilization with Head Rotation  - 2-3 x daily - 7 x weekly - 1 sets - 2 reps - 30 seconds hold - Supine to Right Sidelying Vestibular Habituation  - 1 x daily - 7 x weekly - 3 sets - 10 reps - Romberg Stance Eyes Closed on Foam Pad  - 1 x daily - 7 x weekly - 3 sets - 10 reps - Half Tandem Stance Balance with Eyes Closed and Head Nods   - 1 x daily - 7 x weekly - 3 sets - 10 reps  GOALS: Goals reviewed with patient? Yes   LONG TERM GOALS: Target date: 12/12/22  The patient will be indep with HEP for gaze adaptation and self treatment of BPPV. Baseline: initiated at eval Goal status: MET on 11/20/22  2.  The patient will have negative positional testing indicating resolution of BPPV. Baseline: Positive R BPPV posterior canalithiasis Goal status: MET on 11/20/22  3.  The patient will be further assessed on foam with eyes closed. Baseline:  to be assessed Goal status: INITIAL  4.  The patient will subjectively  report no vertigo with bed mobility. Baseline: dizziness with R rolling Goal status: INITIAL   5.  The patient will note improvement in balance during morning time. Baseline: Notes unsteadiness worse in AM. Goal status: INITIAL  ASSESSMENT:  CLINICAL IMPRESSION: Negative findings with bilateral findings with Dix-Hallpike and supine roll tests. VORx1 exercises progressed to walking, adding VORc exercise with head turns in yaw and pitch planes. Mild postural sway exhibited with standing balance on compliant surface; exercise progressed with added slow head turns with mild increase in sway. Habituation exercise with repeated rolling to R from sitting to supine completed with no exacerbation of symptoms.  OBJECTIVE IMPAIRMENTS: decreased balance and dizziness.   ACTIVITY LIMITATIONS: bending and bed mobility  PARTICIPATION LIMITATIONS: community activity  PERSONAL FACTORS: 1 comorbidity: h/o vertigo  are also affecting patient's functional outcome.   REHAB POTENTIAL: Good  CLINICAL DECISION MAKING: Stable/uncomplicated  EVALUATION COMPLEXITY: Low   PLAN:  PT FREQUENCY: 1x/week  PT DURATION: 4 weeks  PLANNED INTERVENTIONS: Therapeutic exercises, Therapeutic activity, Neuromuscular re-education, Balance training, Gait training, Patient/Family education, Self Care, and Canalith repositioning  PLAN  FOR NEXT SESSION:  Habituation exercises (rolling to R); EC activities on airex with head turns   Hardin Negus, PTA 11/20/2022, 11:46 AM

## 2022-11-21 ENCOUNTER — Other Ambulatory Visit (HOSPITAL_BASED_OUTPATIENT_CLINIC_OR_DEPARTMENT_OTHER): Payer: Self-pay

## 2022-12-04 ENCOUNTER — Ambulatory Visit: Payer: Commercial Managed Care - PPO | Attending: Family Medicine

## 2022-12-04 DIAGNOSIS — H8111 Benign paroxysmal vertigo, right ear: Secondary | ICD-10-CM | POA: Diagnosis present

## 2022-12-04 DIAGNOSIS — R42 Dizziness and giddiness: Secondary | ICD-10-CM

## 2022-12-04 NOTE — Therapy (Addendum)
 OUTPATIENT PHYSICAL THERAPY VESTIBULAR EVALUATION AND DISCHARGE   Patient Name: Krista Schwartz MRN: [id number] DOB:December 22, 1955, 67 y.o., female Today's Date: 12/04/2022   PHYSICAL THERAPY DISCHARGE SUMMARY  Visits from Start of Care: 3  Current functional level related to goals / functional outcomes: See note below for last known status  Remaining deficits: See note below  Education / Equipment: HEP   Patient agrees to discharge. Patient goals were partially met. Patient is being discharged due to not returning since the last visit.  END OF SESSION:  PT End of Session - 12/04/22 1106     Visit Number 3    Number of Visits 8    Date for PT Re-Evaluation 12/14/22    Authorization Type [employer]    PT Start Time 1105    PT Stop Time 1147    PT Time Calculation (min) 42 min    Activity Tolerance Patient tolerated treatment well    Behavior During Therapy WFL for tasks assessed/performed             Past Medical History:  Diagnosis Date   Cancer    basal and squamous cell   Chicken pox    Complication of anesthesia 1994   tubal ligation slow to awaken and bp dropped went home after surgery   Cough 08/25/2020   last 2 weeks spoke with med md telehealth 08-23-2020 on azithromycin  250 qd x 5 days, nonproductive cough   COVID 05/2019   headache and faigue x 4 -5 days all symptoms resolved   GERD (gastroesophageal reflux disease)    no current meds taken   H/O seasonal allergies    Heart palpitations occ since sept 2021   monitor done nov 2021 results not back yet   Migraines    none in several yrs   PMB (postmenopausal bleeding) 07/2020   Wears glasses    Past Surgical History:  Procedure Laterality Date   BASAL CELL CARCINOMA EXCISION     jan 2021 mohs procedure lip mohs also done 8- 10 yrs ago on lip on other side   DILITATION & CURRETTAGE/HYSTROSCOPY WITH NOVASURE ABLATION N/A 08/30/2020   Procedure: DILATATION & CURETTAGE/HYSTEROSCOPY WITH NOVASURE ABLATION;  POLYPECTOMY WITH MYOSURE AND PERICERVICAL BLOCK;  Surgeon: Lenord Radon, MD;  Location: [facility];  Service: Gynecology;  Laterality: N/A;   TUBAL LIGATION  1994   WISDOM TOOTH EXTRACTION  yrs ago   Patient Active Problem List   Diagnosis Date Noted   History of skin cancer 06/14/2016   Post-menopausal bleeding 06/15/2013   Breast lipoma 06/15/2013    PCP: Gates Kasal, MD REFERRING PROVIDER: Gates Kasal, MD REFERRING DIAG: vertigo THERAPY DIAG:  BPPV (benign paroxysmal positional vertigo), right  Dizziness and giddiness  ONSET DATE: 11/08/22  Rationale for Evaluation and Treatment: Rehabilitation  SUBJECTIVE:   SUBJECTIVE STATEMENT: Patient reports for the last four days she has been getting dizzy when looking up, states sometimes she can feel woozy with it. Patient states she has not had any dizziness with rolling to R or turning head to R.   Pt accompanied by: self  PERTINENT HISTORY: h/o migraines (none in a long time)  PAIN:  Are you having pain? No  PRECAUTIONS: None  WEIGHT BEARING RESTRICTIONS: No  FALLS: Has patient fallen in last 6 months? No  LIVING ENVIRONMENT: Lives with: lives with their spouse Lives in: House/apartment Stairs: Yes: Internal: 12-14 steps; one rail Occupation: She works as a Paediatric nurse   PLOF: Independent  PATIENT GOALS: Resolution of dizziness.  OBJECTIVE:   POSTURE:  WNLs  Cervical ROM:    Active A/PROM (deg) eval  Flexion WFLs  Extension WFLs  Right lateral flexion WFLs  Left lateral flexion WFLs  Right rotation WFLs  Left rotation WFLs   PATIENT SURVEYS:  FOTO not completed  VESTIBULAR ASSESSMENT:  GENERAL OBSERVATION: Walks independently without a device.   SYMPTOM BEHAVIOR:  Subjective history: sensation of dizziness worse in morning and with being on her R side  Non-Vestibular symptoms:  none  Type of dizziness: Imbalance (Disequilibrium),  Spinning/Vertigo, and Unsteady with head/body turns  Frequency: daily   Duration: seconds to minutes  Aggravating factors: Induced by position change: rolling to the right  Relieving factors: head stationary and lying supine  Progression of symptoms: worse  OCULOMOTOR EXAM:  Ocular Alignment: normal *do notice mild L head tilt with R eye hypertropia  Ocular ROM: No Limitations  Spontaneous Nystagmus: absent  Gaze-Induced Nystagmus: absent  Smooth Pursuits: intact  Saccades: intact  Other: wears progressive lenses during the day  VESTIBULAR - OCULAR REFLEX:   Slow VOR: Comment: provokes 2-3/10 dizziness, hard to maintain gaze  Head-Impulse Test: HIT Right: positive HIT Left: positive    POSITIONAL TESTING: Right Dix-Hallpike: upbeating, right nystagmus Left Dix-Hallpike: no nystagmus Right Roll Test: positive, however for upbeating, rotary  Left Roll Test: negative   [facility] Adult PT Treatment:                                                DATE: 12/04/2022 Neuromuscular re-ed: STM cervical paraspinals, cervical upglides, SO, SOR, UT Supine cervical chin tucks (towel roll behind head) Cervical extension SNAGs Cervical rotational SNAGs 5x5" B Habituation (seated): forward bending <--> upright sitting look up/forward forward bending <--> sitting looking up Fwd amb head nods up/down Fwd amb Y/P every 3 steps --> alternating Y/P     [facility] Adult PT Treatment:                                                DATE: 11/20/2022 Neuromuscular re-ed: Dix-Hallpike (-) B Supine Roll test (-) B Fwd amb VORx1 Y/P far target (fast pace) Fwd amb VORc Y/P (fast pace) Airex: mREC static balace x30" --> added slow head turns Y/P 4x10" each x 2 rounds REC static balace x30" --> added slow head turns Y/P 4x10" each Reading eye chart with head shake Habituation: R side rolling sitting <-->supine    PATIENT EDUCATION: Education details: nature of condition, HEP Person educated:  Patient Education method: Engineer, structural Education comprehension: verbalized understanding,  HOME EXERCISE PROGRAM:   Access Code: [email or web address] URL: [email or web address] Date: 11/20/2022 Prepared by: Sims Duck  Exercises - Seated Gaze Stabilization with Head Rotation  - 2-3 x daily - 7 x weekly - 1 sets - 2 reps - 30 seconds hold - Supine to Right Sidelying Vestibular Habituation  - 1 x daily - 7 x weekly - 3 sets - 10 reps - Romberg Stance Eyes Closed on Foam Pad  - 1 x daily - 7 x weekly - 3 sets - 10 reps - Half Tandem Stance Balance with Eyes Closed and Head Nods  - 1 x daily - 7 x weekly - 3  sets - 10 reps  GOALS: Goals reviewed with patient? Yes   LONG TERM GOALS: Target date: 12/12/22  The patient will be indep with HEP for gaze adaptation and self treatment of BPPV. Baseline: initiated at eval Goal status: MET on 11/20/22  2.  The patient will have negative positional testing indicating resolution of BPPV. Baseline: Positive R BPPV posterior canalithiasis Goal status: MET on 11/20/22  3.  The patient will be further assessed on foam with eyes closed. Baseline:  to be assessed Goal status: INITIAL  4.  The patient will subjectively report no vertigo with bed mobility. Baseline: dizziness with R rolling Goal status: INITIAL   5.  The patient will note improvement in balance during morning time. Baseline: Notes unsteadiness worse in AM. Goal status: INITIAL  ASSESSMENT:  CLINICAL IMPRESSION: Subjective symptoms of dizziness when looking up improved with STM along cervical musculature. Patient instructed in soft-tissue mobilization techniques to address cervical tightness and tension at suboccipitals and cervical paraspinals to decrease cervical tension.  Patient able to progress to walking with head turns in pitch plane during VORc with no exacerbation of symptoms.   OBJECTIVE IMPAIRMENTS: decreased balance and dizziness.   ACTIVITY LIMITATIONS:  bending and bed mobility  PARTICIPATION LIMITATIONS: community activity  PERSONAL FACTORS: 1 comorbidity: h/o vertigo  are also affecting patient's functional outcome.   REHAB POTENTIAL: Good  CLINICAL DECISION MAKING: Stable/uncomplicated  EVALUATION COMPLEXITY: Low   PLAN:  PT FREQUENCY: 1x/week  PT DURATION: 4 weeks  PLANNED INTERVENTIONS: Therapeutic exercises, Therapeutic activity, Neuromuscular re-education, Balance training, Gait training, Patient/Family education, Self Care, and Canalith repositioning  PLAN FOR NEXT SESSION:  Follow up on self massage to SO & dizziness looking up; EC activities on airex with head turns  WEAVER,CHRISTINA, PT  Flint Hummer, PTA 12/04/2022, 11:52 AM

## 2022-12-18 ENCOUNTER — Ambulatory Visit: Payer: Commercial Managed Care - PPO

## 2023-01-10 IMAGING — MG MM DIGITAL DIAGNOSTIC UNILAT*R* W/ TOMO W/ CAD
8 series · 8 of 24 positions shown · non-contrast
Comparison: Previous exam(s).

CLINICAL DATA: 65-year-old female presenting as a recall from
screening for possible right breast asymmetries.

EXAM:
DIGITAL DIAGNOSTIC UNILATERAL RIGHT MAMMOGRAM WITH TOMOSYNTHESIS AND
CAD; ULTRASOUND RIGHT BREAST LIMITED
TECHNIQUE: Right digital diagnostic mammography and breast tomosynthesis was
performed. The images were evaluated with computer-aided detection.;
Targeted ultrasound examination of the right breast was performed

[R MLO synth-2D]
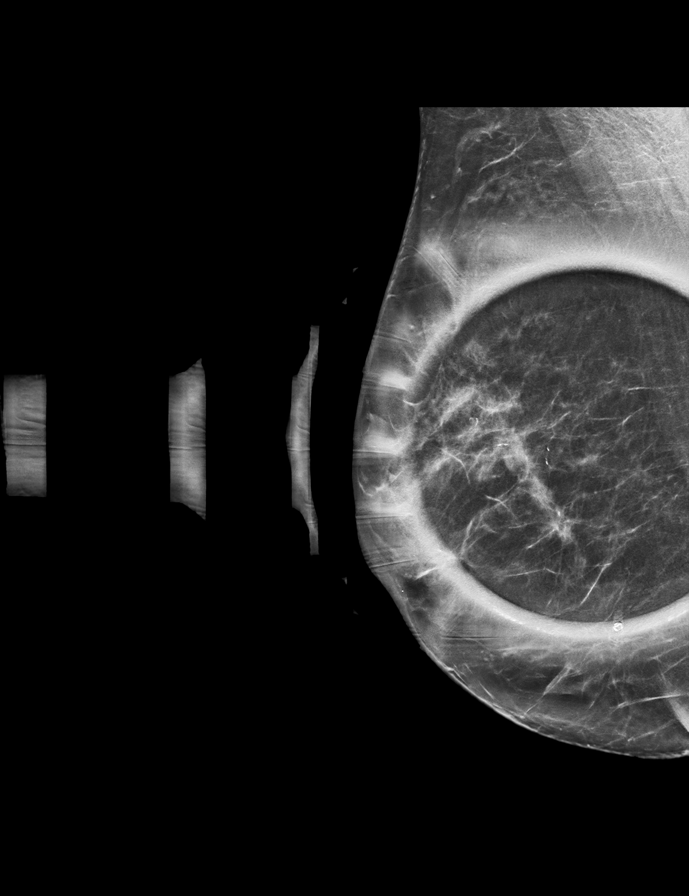

[R CC synth-2D (1 of 2)]
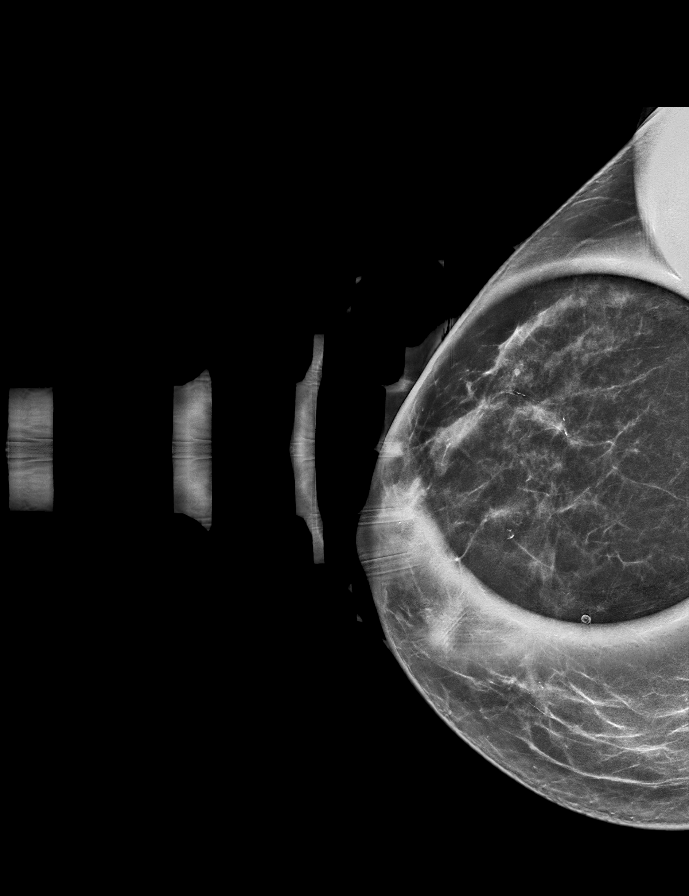

[R CC synth-2D (2 of 2)]
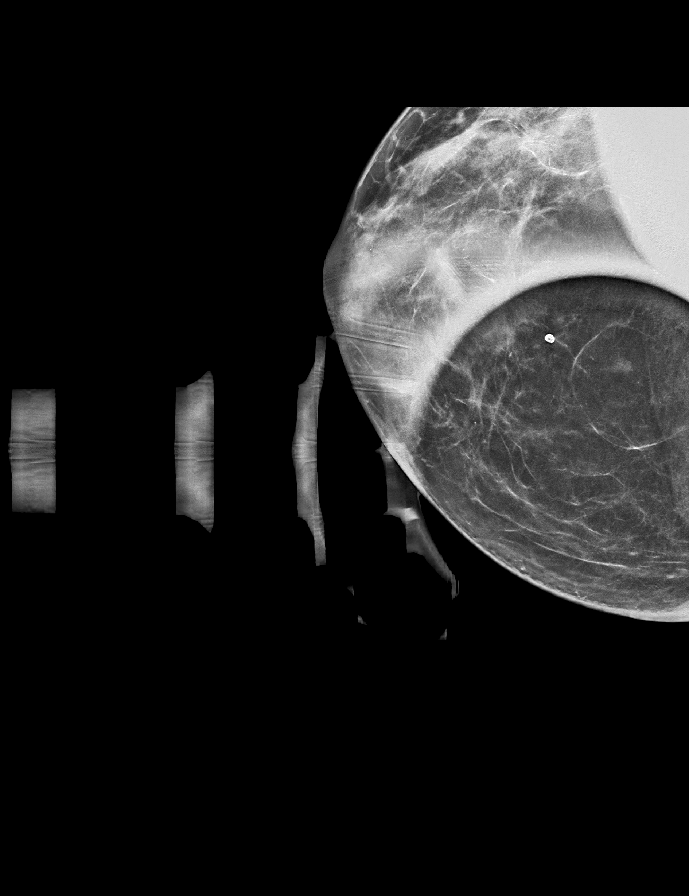

[R ML synth-2D]
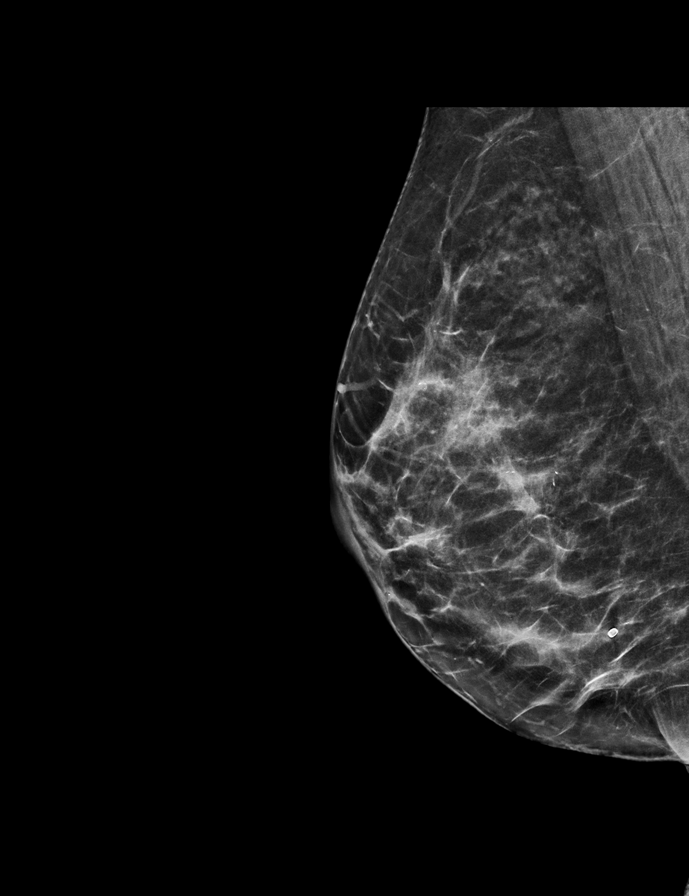

[R CC tomo (1 of 2) · tomo slice 29/57.0]
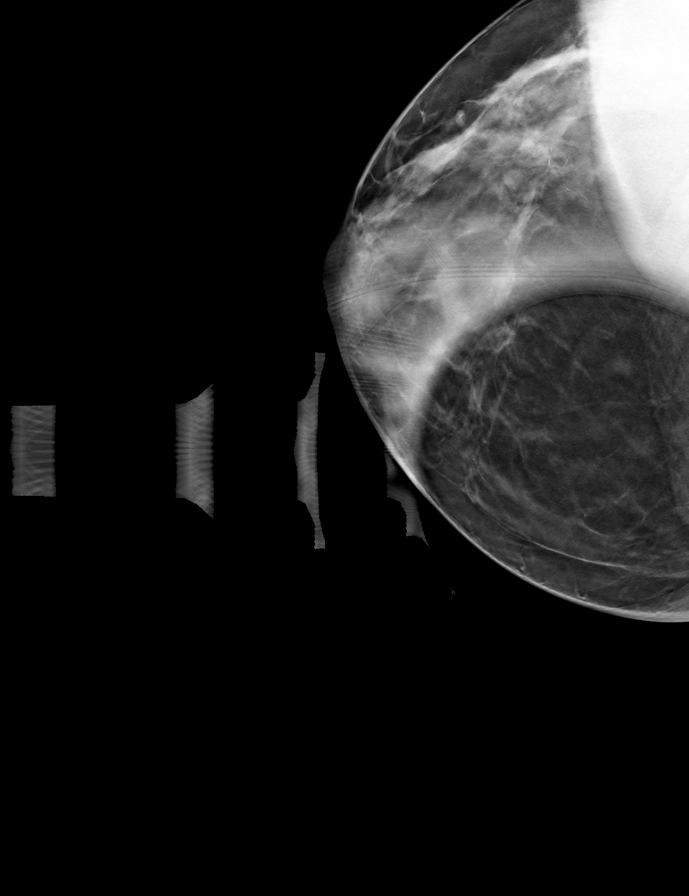

[R MLO tomo · tomo slice 31/61.0]
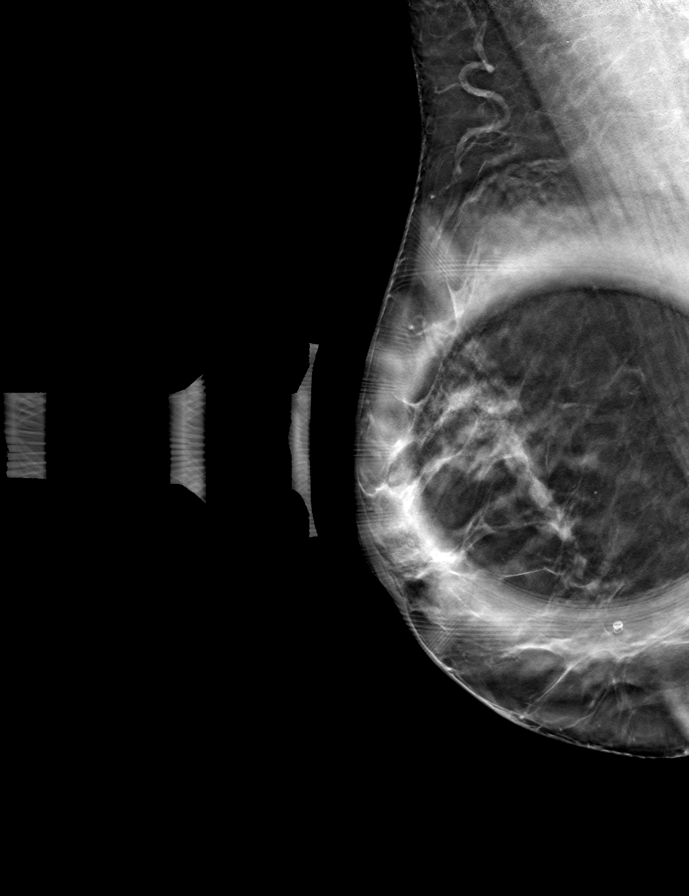

[R ML tomo · tomo slice 29/58.0]
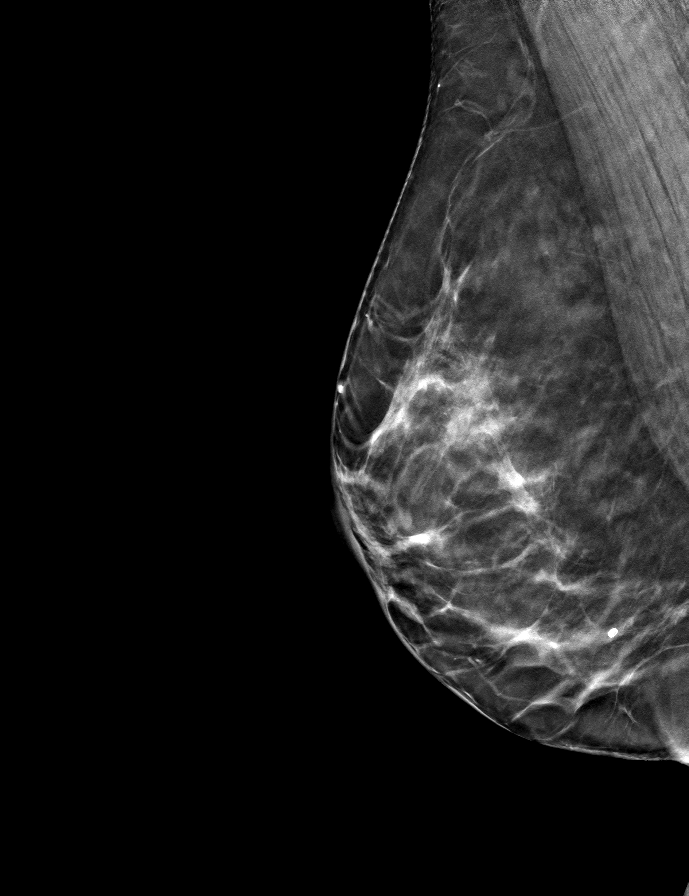

[R CC tomo (2 of 2) · tomo slice 29/58.0]
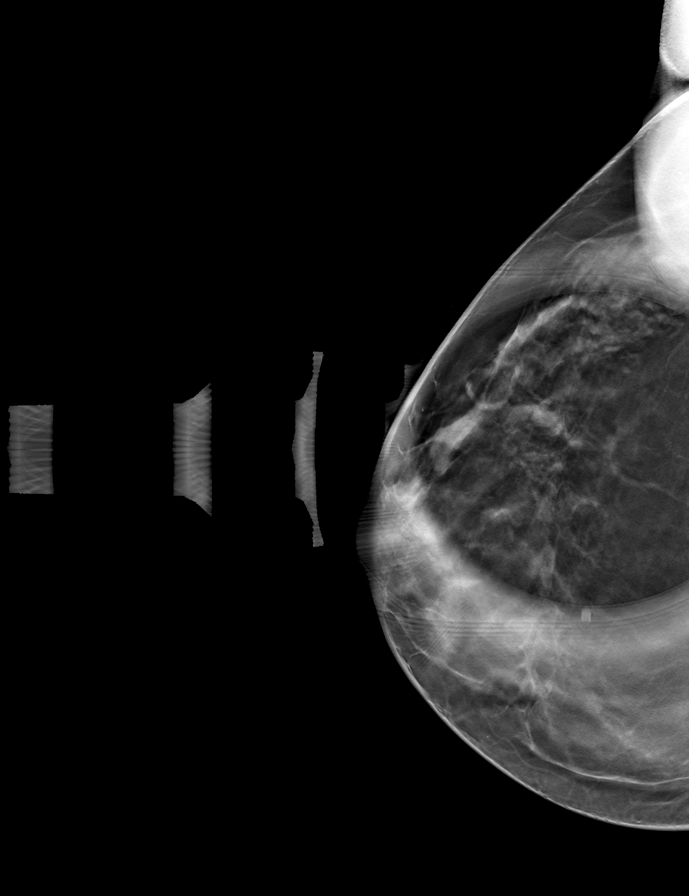

[8 of 24 positions shown; findings below may reference images not displayed]

ACR Breast Density Category c: The breast tissue is heterogeneously
dense, which may obscure small masses.
FINDINGS: Mammogram:

Right breast: Spot compression tomosynthesis and full true lateral
tomosynthesis views of the right breast were performed demonstrating
persistence of a small oval circumscribed mass in the medial
posterior left breast. There is also at least 1 obscured small mass
in the slightly outer right breast.

Ultrasound:

Targeted ultrasound is performed in the right breast at 4 o'clock 3
cm from the nipple demonstrating an oval circumscribed anechoic mass
with a few small internal echoes measuring 0.4 x 0.2 x 0.4 cm,
consistent with a benign cyst. This corresponds to the mammographic
finding.

Targeted ultrasound of the right axilla at 9 o'clock 2-3 cm from the
nipple demonstrates multiple oval circumscribed anechoic masses
consistent with simple cysts. A representative cyst at 9 o'clock 2
cm from the nipple measures 0.6 x 0.4 x 0.5 cm. These correspond to
the mammographic findings.
IMPRESSION: Benign subcentimeter simple cysts in the medial and slightly lateral
right breast. No mammographic or sonographic evidence of malignancy.

RECOMMENDATION:
Screening mammogram in one year.(Code:LV-C-JM1)

I have discussed the findings and recommendations with the patient.
If applicable, a reminder letter will be sent to the patient
regarding the next appointment.

BI-RADS CATEGORY  2: Benign.

## 2023-01-10 IMAGING — US US BREAST*R* LIMITED INC AXILLA
1 series · 13 of 16 positions shown · non-contrast
Comparison: Previous exam(s).

CLINICAL DATA: 65-year-old female presenting as a recall from
screening for possible right breast asymmetries.

EXAM:
DIGITAL DIAGNOSTIC UNILATERAL RIGHT MAMMOGRAM WITH TOMOSYNTHESIS AND
CAD; ULTRASOUND RIGHT BREAST LIMITED
TECHNIQUE: Right digital diagnostic mammography and breast tomosynthesis was
performed. The images were evaluated with computer-aided detection.;
Targeted ultrasound examination of the right breast was performed

[Series 1: us breast*right* limited inc axilla · 0.05mm/px · 13 of 16 slices shown]
[im 1/16]
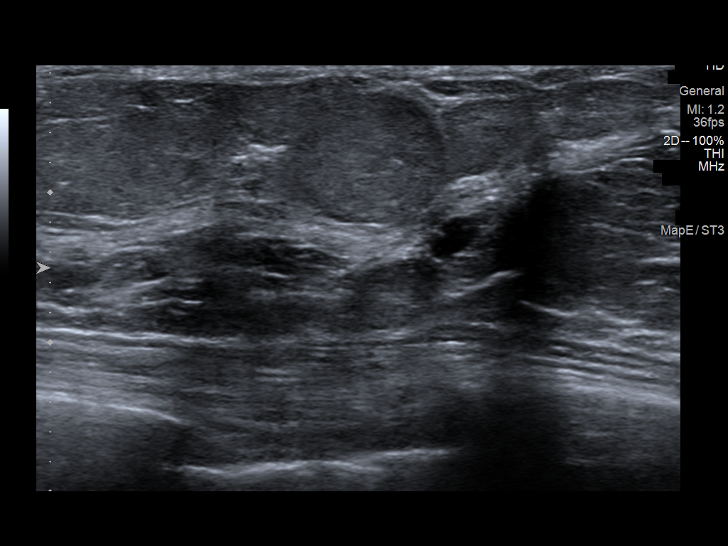
[im 2/16]
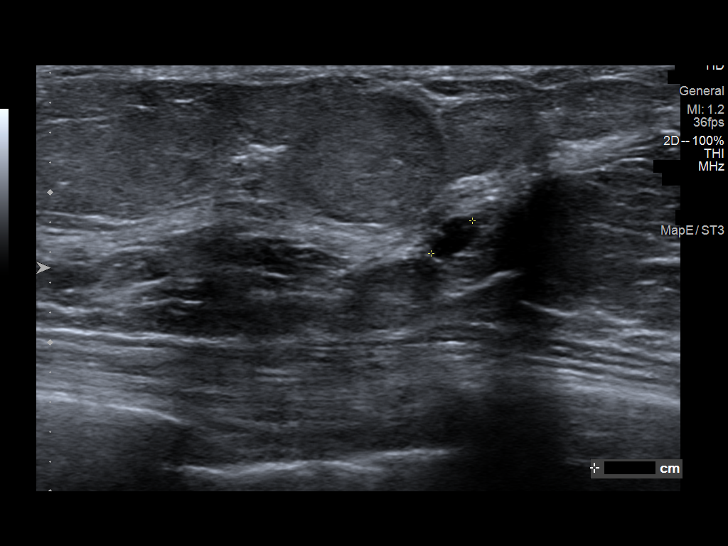
[im 4/16]
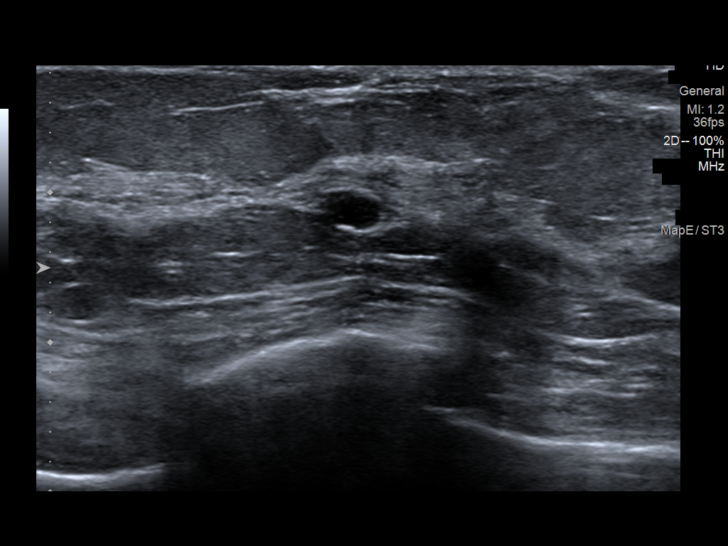
[im 5/16]
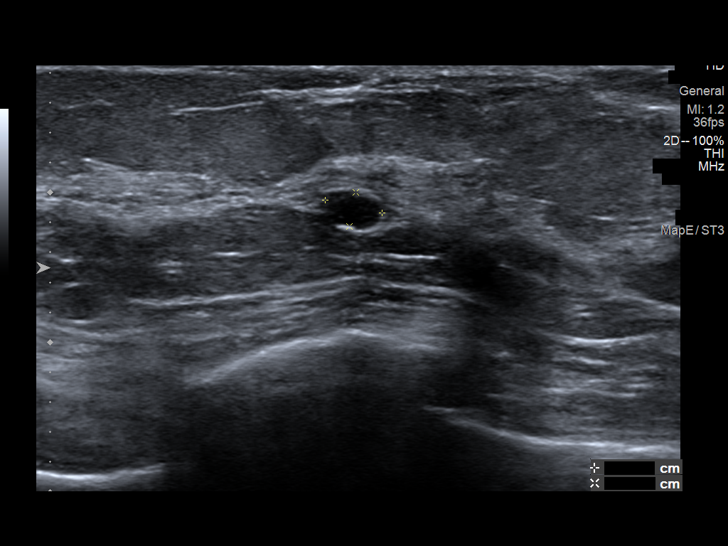
[im 6/16]
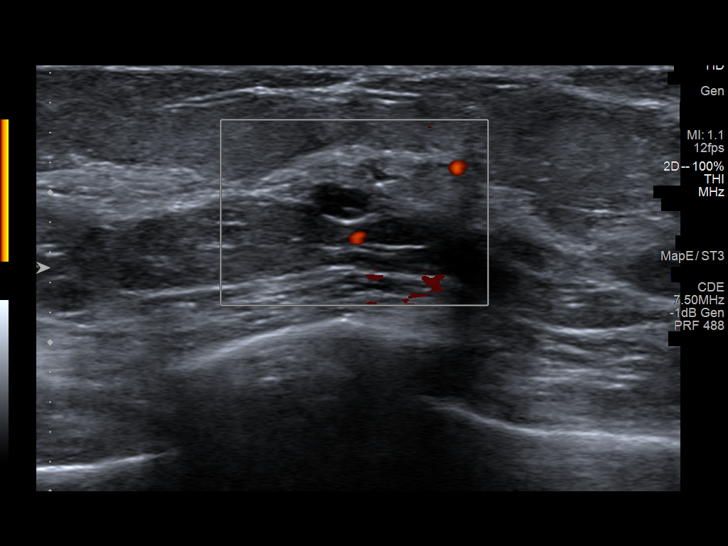
[im 7/16]
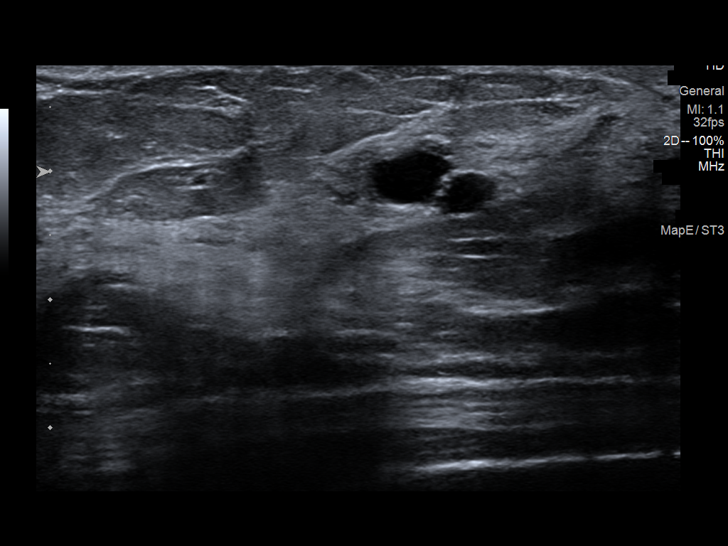
[im 9/16]
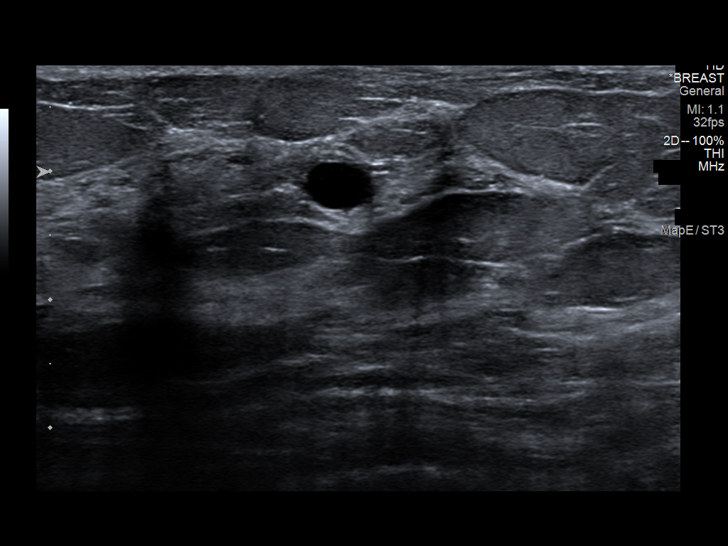
[im 10/16]
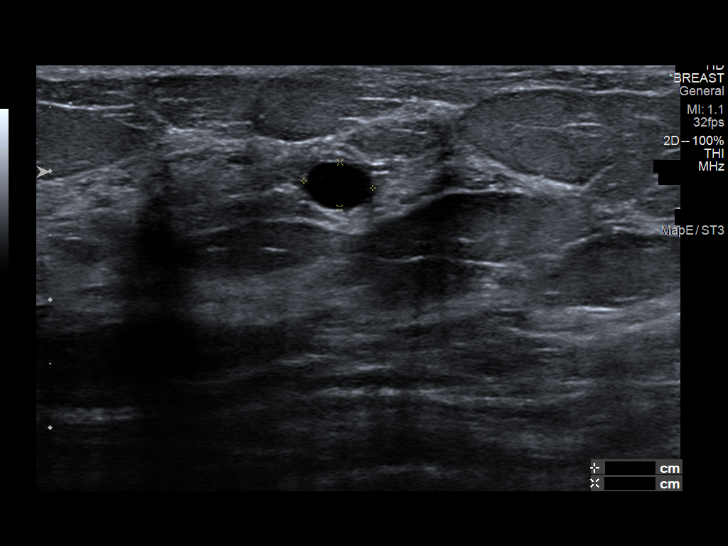
[im 11/16]
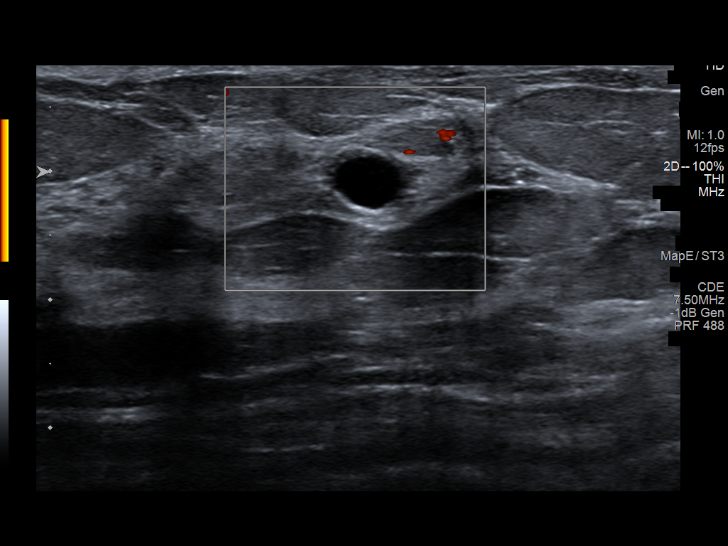
[im 12/16]
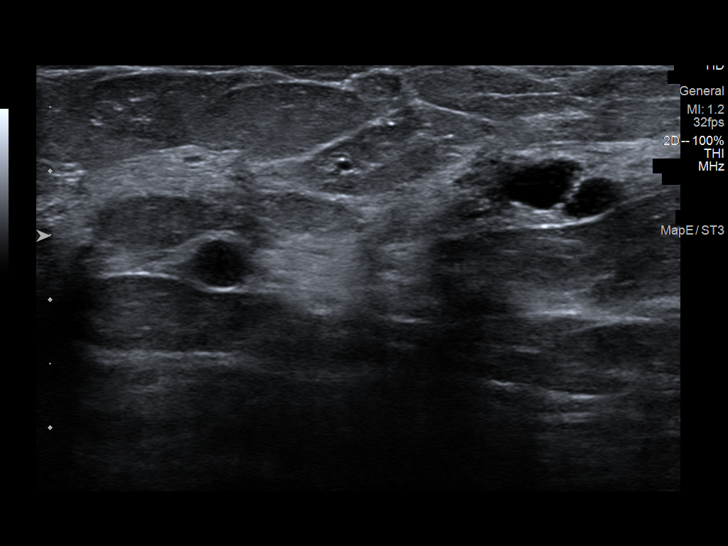
[im 13/16]
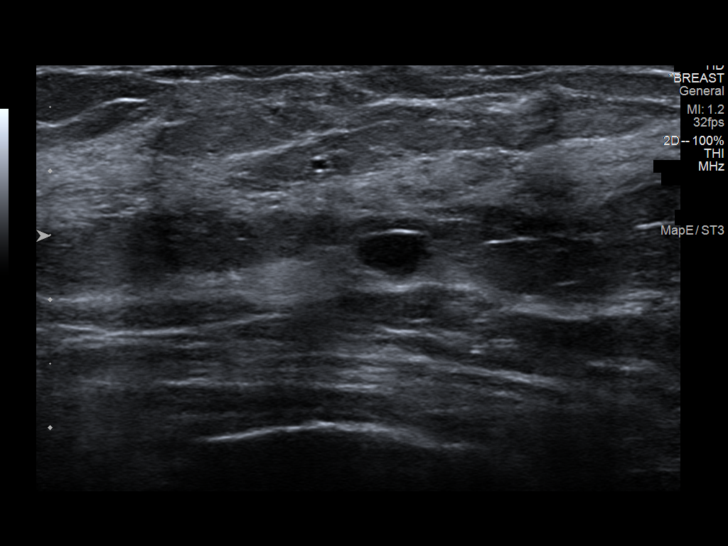
[im 15/16]
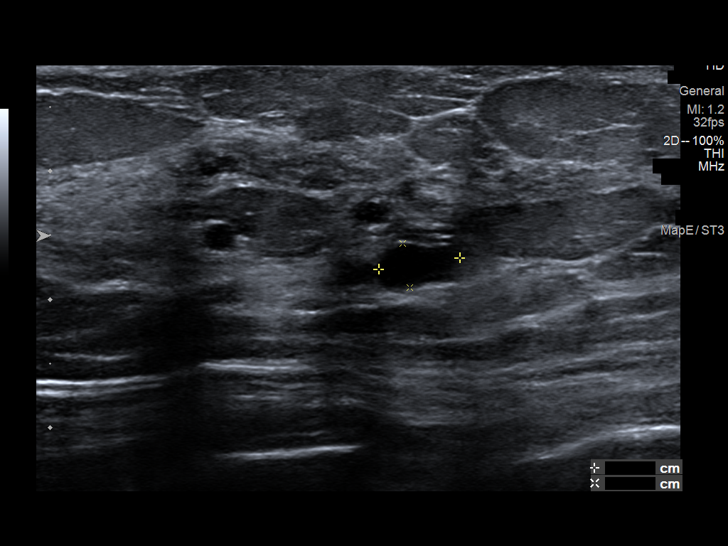
[im 16/16]
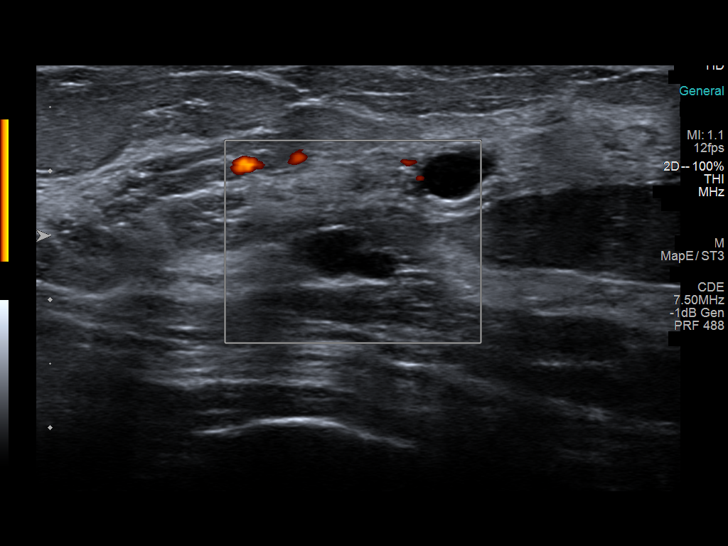

[13 of 16 positions shown; findings below may reference images not displayed]

ACR Breast Density Category c: The breast tissue is heterogeneously
dense, which may obscure small masses.
FINDINGS: Mammogram:

Right breast: Spot compression tomosynthesis and full true lateral
tomosynthesis views of the right breast were performed demonstrating
persistence of a small oval circumscribed mass in the medial
posterior left breast. There is also at least 1 obscured small mass
in the slightly outer right breast.

Ultrasound:

Targeted ultrasound is performed in the right breast at 4 o'clock 3
cm from the nipple demonstrating an oval circumscribed anechoic mass
with a few small internal echoes measuring 0.4 x 0.2 x 0.4 cm,
consistent with a benign cyst. This corresponds to the mammographic
finding.

Targeted ultrasound of the right axilla at 9 o'clock 2-3 cm from the
nipple demonstrates multiple oval circumscribed anechoic masses
consistent with simple cysts. A representative cyst at 9 o'clock 2
cm from the nipple measures 0.6 x 0.4 x 0.5 cm. These correspond to
the mammographic findings.
IMPRESSION: Benign subcentimeter simple cysts in the medial and slightly lateral
right breast. No mammographic or sonographic evidence of malignancy.

RECOMMENDATION:
Screening mammogram in one year.(Code:LV-C-JM1)

I have discussed the findings and recommendations with the patient.
If applicable, a reminder letter will be sent to the patient
regarding the next appointment.

BI-RADS CATEGORY  2: Benign.

## 2023-01-29 NOTE — Progress Notes (Signed)
[facility] [address] [place], [place] [address] [phone]  Date:  02/04/2023   Name:  Krista Schwartz   DOB:  02-11-1956   MRN:  [id number]  PCP:  Pearline Cables, MD    Chief Complaint: Annual Exam (Concerns/ questions: /Shingrix:/HIV screen due)   History of Present Illness:  Krista Schwartz is a 67 y.o. 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  Her school years was pretty good   Shingrix- give first dose today  Cologuard 5/23, negative Pap screening per GYN-completed last fall Mammogram 5/23- she will schedule soon.  She did need a diagnostic and ultrasound last year, was directed to return to annual screening Blood work completed 1 year ago, can update She is on HRT per her gynecologist  Never smoker, rare alcohol Enjoys walking for exercise- she is still doing this  7 grands ages 69 to 61 yo  Patient Active Problem List   Diagnosis Date Noted   History of skin cancer 06/14/2016   Post-menopausal bleeding 06/15/2013   Breast lipoma 06/15/2013    Past Medical History:  Diagnosis Date   Cancer (HCC)    basal and squamous cell   Chicken pox    Complication of anesthesia 1994   tubal ligation slow to awaken and bp dropped went home after surgery   Cough 08/25/2020   last 2 weeks spoke with med md telehealth 08-23-2020 on azithromycin 250 qd x 5 days, nonproductive cough   COVID 05/2019   headache and faigue x 4 -5 days all symptoms resolved   GERD (gastroesophageal reflux disease)    no current meds taken   H/O seasonal allergies    Heart palpitations occ since sept 2021   monitor done nov 2021  results not back yet   Migraines    none in several yrs   PMB (postmenopausal bleeding) 07/2020   Wears glasses     Past Surgical History:  Procedure Laterality Date   BASAL CELL CARCINOMA EXCISION     jan 2021 mohs procedure lip mohs also done 8- 10 yrs ago on lip on other side   DILITATION & CURRETTAGE/HYSTROSCOPY WITH NOVASURE ABLATION N/A 08/30/2020   Procedure: DILATATION & CURETTAGE/HYSTEROSCOPY WITH NOVASURE ABLATION; POLYPECTOMY WITH MYOSURE AND PERICERVICAL BLOCK;  Surgeon: Willodean Rosenthal, MD;  Location: [facility];  Service: Gynecology;  Laterality: N/A;   TUBAL LIGATION  1994   WISDOM TOOTH EXTRACTION  yrs ago    Social History   Tobacco Use   Smoking status: Never   Smokeless tobacco: Never  Vaping Use   Vaping Use: Never used  Substance Use Topics   Alcohol use: Yes    Alcohol/week: 0.0 standard drinks of alcohol    Comment: rare   Drug use: No    Family History  Problem Relation Age of Onset   Parkinson's disease Mother    Heart attack Father    Cancer Paternal Grandfather        skin   Cancer Maternal Grandmother        skin   Hypertension Other    Hyperlipidemia Other     No Known Allergies  Medication list has been reviewed and updated.  Current Outpatient Medications on File Prior to Visit  Medication Sig Dispense Refill   cetirizine (ZYRTEC) 10 MG tablet Take 10 mg by mouth at bedtime.     Cholecalciferol (VITAMIN D3) 5000 units TABS Take 1 tablet by mouth every other day.     estradiol (VIVELLE-DOT) 0.05 MG/24HR patch Place 1 patch (0.05 mg total) onto the skin 2 (two) times a week. 24 patch 6   medroxyPROGESTERone (PROVERA) 2.5 MG tablet Take 1 tablet (2.5 mg total) by mouth daily. Days 1-5 of the month. 45 tablet 4   Multiple Vitamin (MULTIVITAMIN) tablet Take 1 tablet by mouth daily.     No current facility-administered medications on file prior to visit.    Review of Systems:  As per HPI- otherwise  negative.   Physical Examination: Vitals:   02/04/23 0852  BP: 118/60  Pulse: (!) 50  Resp: 18  Temp: 97.7 F (36.5 C)  SpO2: 97%   Vitals:   02/04/23 0852  Weight: 155 lb (70.3 kg)  Height: 5\' 5"  (1.651 m)   Body mass index is 25.79 kg/m. Ideal Body Weight: Weight in (lb) to have BMI = 25: 149.9  GEN: no acute distress. HEENT: Atraumatic, Normocephalic.  Ears and Nose: No external deformity. CV: RRR, No M/G/R. No JVD. No thrill. No extra heart sounds. PULM: CTA B, no wheezes, crackles, rhonchi. No retractions. No resp. distress. No accessory muscle use. ABD: S, NT, ND, +BS. No rebound. No HSM. EXTR: No c/c/e PSYCH: Normally interactive. Conversant.   Pulse Readings from Last 3 Encounters:  02/04/23 (!) 50  10/15/22 60  04/11/22 (!) 53    Assessment and Plan: Physical exam - Plan: CT CARDIAC SCORING (SELF PAY ONLY)  Thyroid disorder screening - Plan: TSH  Screening for diabetes mellitus - Plan: Comprehensive metabolic panel, Hemoglobin A1c  Screening for deficiency anemia - Plan: CBC  Fatigue, unspecified type - Plan: VITAMIN D 25 Hydroxy (Vit-D Deficiency, Fractures)  Screening, lipid - Plan: Lipid panel  Encounter for screening mammogram for malignant neoplasm of breast - Plan: MM 3D SCREENING MAMMOGRAM BILATERAL BREAST  Immunization due - Plan: Zoster Recombinant (Shingrix )  Physical exam today.  Encouraged healthy diet and exercise routine. Will plan further follow- up pending labs. Ordered mammogram, CT coronary calcium First dose Shingrix today   Signed Abbe Amsterdam, MD  Received labs as below, message to patient  Results for orders placed or performed in visit on 02/04/23  CBC  Result Value Ref Range   WBC 6.8 4.0 - 10.5 K/uL   RBC 4.82 3.87 - 5.11 Mil/uL   Platelets 278.0 150.0 - 400.0 K/uL   Hemoglobin 14.0 12.0 - 15.0 g/dL   HCT 96.0 45.4 - 09.8 %   MCV 89.3 78.0 - 100.0 fl   MCHC 32.6 30.0 - 36.0 g/dL   RDW 11.9 14.7 - 82.9 %   Comprehensive metabolic panel  Result Value Ref Range   Sodium 139 135 - 145 mEq/L   Potassium 4.3 3.5 - 5.1 mEq/L   Chloride 103 96 - 112 mEq/L   CO2 27 19 - 32 mEq/L   Glucose, Bld 87 70 - 99 mg/dL   BUN 13 6 - 23 mg/dL   Creatinine, Ser 5.62 0.40 - 1.20 mg/dL   Total Bilirubin 0.4 0.2 - 1.2 mg/dL   Alkaline Phosphatase 67 39 - 117 U/L   AST 14 0 - 37 U/L   ALT 9 0 - 35  U/L   Total Protein 6.3 6.0 - 8.3 g/dL   Albumin 4.1 3.5 - 5.2 g/dL   GFR 46.96 >29.52 mL/min   Calcium 9.1 8.4 - 10.5 mg/dL  Hemoglobin W4X  Result Value Ref Range   Hgb A1c MFr Bld 5.2 4.6 - 6.5 %  Lipid panel  Result Value Ref Range   Cholesterol 212 (H) 0 - 200 mg/dL   Triglycerides 324.4 0.0 - 149.0 mg/dL   HDL 01.02 >72.53 mg/dL   VLDL 66.4 0.0 - 40.3 mg/dL   LDL Cholesterol 474 (H) 0 - 99 mg/dL   Total CHOL/HDL Ratio 4    NonHDL 162.06   TSH  Result Value Ref Range   TSH 2.17 0.35 - 5.50 uIU/mL  VITAMIN D 25 Hydroxy (Vit-D Deficiency, Fractures)  Result Value Ref Range   VITD 72.63 30.00 - 100.00 ng/mL

## 2023-01-29 NOTE — Patient Instructions (Signed)
It was great to see again today, I will be in touch with your labs soon as possible I have you have a great summer

## 2023-02-04 ENCOUNTER — Ambulatory Visit (HOSPITAL_BASED_OUTPATIENT_CLINIC_OR_DEPARTMENT_OTHER)
Admission: RE | Admit: 2023-02-04 | Discharge: 2023-02-04 | Disposition: A | Discharge status: Home / Self Care | Payer: Self-pay | Source: Ambulatory Visit | Attending: Family Medicine | Admitting: Family Medicine

## 2023-02-04 ENCOUNTER — Ambulatory Visit (HOSPITAL_BASED_OUTPATIENT_CLINIC_OR_DEPARTMENT_OTHER)
Admission: RE | Admit: 2023-02-04 | Discharge: 2023-02-04 | Disposition: A | Discharge status: Home / Self Care | Payer: Commercial Managed Care - PPO | Source: Ambulatory Visit | Attending: Family Medicine | Admitting: Family Medicine

## 2023-02-04 ENCOUNTER — Ambulatory Visit (INDEPENDENT_AMBULATORY_CARE_PROVIDER_SITE_OTHER): Payer: Commercial Managed Care - PPO | Admitting: Family Medicine

## 2023-02-04 ENCOUNTER — Encounter: Payer: Self-pay | Admitting: Family Medicine

## 2023-02-04 ENCOUNTER — Encounter (HOSPITAL_BASED_OUTPATIENT_CLINIC_OR_DEPARTMENT_OTHER): Payer: Self-pay

## 2023-02-04 VITALS — BP 118/60 | HR 50 | Temp 97.7°F | Resp 18 | Ht 65.0 in | Wt 155.0 lb

## 2023-02-04 DIAGNOSIS — Z0000 Encounter for general adult medical examination without abnormal findings: Secondary | ICD-10-CM | POA: Diagnosis not present

## 2023-02-04 DIAGNOSIS — Z131 Encounter for screening for diabetes mellitus: Secondary | ICD-10-CM

## 2023-02-04 DIAGNOSIS — Z13220 Encounter for screening for lipoid disorders: Secondary | ICD-10-CM | POA: Diagnosis not present

## 2023-02-04 DIAGNOSIS — Z1231 Encounter for screening mammogram for malignant neoplasm of breast: Secondary | ICD-10-CM | POA: Insufficient documentation

## 2023-02-04 DIAGNOSIS — Z130 Encounter for screening for diseases of the blood and blood-forming organs and certain disorders involving the immune mechanism: Secondary | ICD-10-CM | POA: Diagnosis not present

## 2023-02-04 DIAGNOSIS — R5383 Other fatigue: Secondary | ICD-10-CM | POA: Diagnosis not present

## 2023-02-04 DIAGNOSIS — Z1329 Encounter for screening for other suspected endocrine disorder: Secondary | ICD-10-CM | POA: Diagnosis not present

## 2023-02-04 DIAGNOSIS — Z23 Encounter for immunization: Secondary | ICD-10-CM

## 2023-02-04 LAB — COMPREHENSIVE METABOLIC PANEL
ALT: 9 U/L (ref 0–35)
AST: 14 U/L (ref 0–37)
Albumin: 4.1 g/dL (ref 3.5–5.2)
Alkaline Phosphatase: 67 U/L (ref 39–117)
BUN: 13 mg/dL (ref 6–23)
CO2: 27 mEq/L (ref 19–32)
Calcium: 9.1 mg/dL (ref 8.4–10.5)
Chloride: 103 mEq/L (ref 96–112)
Creatinine, Ser: 0.98 mg/dL (ref 0.40–1.20)
GFR: 61.01 mL/min (ref >60.00)
Glucose, Bld: 87 mg/dL (ref 70–99)
Potassium: 4.3 mEq/L (ref 3.5–5.1)
Sodium: 139 mEq/L (ref 135–145)
Total Bilirubin: 0.4 mg/dL (ref 0.2–1.2)
Total Protein: 6.3 g/dL (ref 6.0–8.3)

## 2023-02-04 LAB — CBC
HCT: 43 % (ref 36.0–46.0)
Hemoglobin: 14 g/dL (ref 12.0–15.0)
MCHC: 32.6 g/dL (ref 30.0–36.0)
MCV: 89.3 fl (ref 78.0–100.0)
Platelets: 278 10*3/uL (ref 150.0–400.0)
RBC: 4.82 Mil/uL (ref 3.87–5.11)
RDW: 13.4 % (ref 11.5–15.5)
WBC: 6.8 10*3/uL (ref 4.0–10.5)

## 2023-02-04 LAB — VITAMIN D 25 HYDROXY (VIT D DEFICIENCY, FRACTURES): VITD: 72.63 ng/mL (ref 30.00–100.00)

## 2023-02-04 LAB — LIPID PANEL
Cholesterol: 212 mg/dL — ABNORMAL HIGH (ref 0–200)
HDL: 50.1 mg/dL (ref >39.00)
LDL Cholesterol: 141 mg/dL — ABNORMAL HIGH (ref 0–99)
NonHDL: 162.06
Total CHOL/HDL Ratio: 4
Triglycerides: 107 mg/dL (ref 0.0–149.0)
VLDL: 21.4 mg/dL (ref 0.0–40.0)

## 2023-02-04 LAB — TSH: TSH: 2.17 u[IU]/mL (ref 0.35–5.50)

## 2023-02-04 LAB — HEMOGLOBIN A1C: Hgb A1c MFr Bld: 5.2 % (ref 4.6–6.5)

## 2023-02-05 ENCOUNTER — Telehealth: Payer: Self-pay | Admitting: Family Medicine

## 2023-02-05 DIAGNOSIS — Z0279 Encounter for issue of other medical certificate: Secondary | ICD-10-CM

## 2023-02-05 NOTE — Telephone Encounter (Signed)
Pt dropped of physician health screening form to be signed by PCP. Pt requests to form be faxed to the fax number on bottom of [phone]). Pt also requests to be called after the form has been completed and faxed.

## 2023-02-06 ENCOUNTER — Encounter: Payer: Self-pay | Admitting: Family Medicine

## 2023-02-06 NOTE — Telephone Encounter (Signed)
Form in folder

## 2023-03-19 ENCOUNTER — Telehealth: Payer: Self-pay | Admitting: Family Medicine

## 2023-03-19 NOTE — Telephone Encounter (Signed)
Pt called stating that she was having issues with being billed for some paperwork that she brought in to be filled out. Pt stated she had her CPE on June 10th and forgot to bring in paperwork for her health insurance premium. Pt stated that Dr. Patsy Lager had stated to bring it in the next day so it could be filled out. Pt brought in paperwork on June 11th paper to be filled out and received a bill for $29 for it being filled out. Pt would like to know why she is being charged as it should be lumped in with the CPE.

## 2023-04-16 ENCOUNTER — Other Ambulatory Visit: Payer: Self-pay

## 2023-04-30 ENCOUNTER — Other Ambulatory Visit: Payer: Self-pay

## 2023-04-30 ENCOUNTER — Other Ambulatory Visit (HOSPITAL_BASED_OUTPATIENT_CLINIC_OR_DEPARTMENT_OTHER): Payer: Self-pay

## 2023-04-30 DIAGNOSIS — N858 Other specified noninflammatory disorders of uterus: Secondary | ICD-10-CM

## 2023-04-30 DIAGNOSIS — Z79890 Hormone replacement therapy (postmenopausal): Secondary | ICD-10-CM

## 2023-04-30 MED ORDER — MEDROXYPROGESTERONE ACETATE 2.5 MG PO TABS
2.5000 mg | ORAL_TABLET | Freq: Every day | ORAL | 4 refills | Status: DC
  Filled 2023-04-30: qty 5, 5d supply, fill #0
  Filled 2023-06-03: qty 5, 5d supply, fill #1
  Filled 2023-06-25: qty 5, 5d supply, fill #2
  Filled 2023-07-22: qty 5, 5d supply, fill #3
  Filled 2023-07-25 – 2023-08-18 (×2): qty 5, 5d supply, fill #4

## 2023-04-30 NOTE — Progress Notes (Signed)
Pt called requesting refill of provera.

## 2023-05-22 ENCOUNTER — Other Ambulatory Visit (HOSPITAL_COMMUNITY)
Admission: RE | Admit: 2023-05-22 | Discharge: 2023-05-22 | Disposition: A | Discharge status: Home / Self Care | Payer: Commercial Managed Care - PPO | Source: Ambulatory Visit | Attending: Obstetrics & Gynecology | Admitting: Obstetrics & Gynecology

## 2023-05-22 ENCOUNTER — Encounter: Payer: Self-pay | Admitting: Obstetrics & Gynecology

## 2023-05-22 ENCOUNTER — Ambulatory Visit: Payer: Commercial Managed Care - PPO | Admitting: Obstetrics & Gynecology

## 2023-05-22 VITALS — BP 135/76 | HR 46 | Ht 65.0 in | Wt 155.0 lb

## 2023-05-22 DIAGNOSIS — Z01419 Encounter for gynecological examination (general) (routine) without abnormal findings: Secondary | ICD-10-CM | POA: Insufficient documentation

## 2023-05-22 NOTE — Patient Instructions (Signed)
Kegel Exercises  Kegel exercises can help strengthen your pelvic floor muscles. The pelvic floor is a group of muscles that support your rectum, small intestine, and bladder. In females, pelvic floor muscles also help support the uterus. These muscles help you control the flow of urine and stool (feces). Kegel exercises are painless and simple. They do not require any equipment. Your provider may suggest Kegel exercises to: Improve bladder and bowel control. Improve sexual response. Improve weak pelvic floor muscles after surgery to remove the uterus (hysterectomy) or after pregnancy, in females. Improve weak pelvic floor muscles after prostate gland removal or surgery, in males. Kegel exercises involve squeezing your pelvic floor muscles. These are the same muscles you squeeze when you try to stop the flow of urine or keep from passing gas. The exercises can be done while sitting, standing, or lying down, but it is best to vary your position. Ask your health care provider which exercises are safe for you. Do exercises exactly as told by your health care provider and adjust them as directed. Do not begin these exercises until told by your health care provider. Exercises How to do Kegel exercises: Squeeze your pelvic floor muscles tight. You should feel a tight lift in your rectal area. If you are a female, you should also feel a tightness in your vaginal area. Keep your stomach, buttocks, and legs relaxed. Hold the muscles tight for up to 10 seconds. Breathe normally. Relax your muscles for up to 10 seconds. Repeat as told by your health care provider. Repeat this exercise daily as told by your health care provider. Continue to do this exercise for at least 4-6 weeks, or for as long as told by your health care provider. You may be referred to a physical therapist who can help you learn more about how to do Kegel exercises. Depending on your condition, your health care provider may  recommend: Varying how long you squeeze your muscles. Doing several sets of exercises every day. Doing exercises for several weeks. Making Kegel exercises a part of your regular exercise routine. This information is not intended to replace advice given to you by your health care provider. Make sure you discuss any questions you have with your health care provider. Document Revised: 12/22/2020 Document Reviewed: 12/22/2020 Elsevier Patient Education  2024 [employer]  

## 2023-05-22 NOTE — Progress Notes (Signed)
Subjective:     Krista Schwartz is a 67 y.o. female here for a routine exam.  Current complaints: pt reports occ incontinence of urine if she doesn't get to the restroom on time. This is not often. She drinks lots of caffeine- iced tea. Recently had one episode of bleeding since her ablation 08/30/2020.         Gynecologic History Patient's last menstrual period was 08/27/2009. Contraception: post menopausal status Last Pap: 04/11/2022. Results were: ASCUS neg hrHPV Last mammogram: 02/04/2023. Results were: normal  Obstetric History OB History  Gravida Para Term Preterm AB Living  3 3 3     3   SAB IAB Ectopic Multiple Live Births               # Outcome Date GA Lbr Len/2nd Weight Sex Type Anes PTL Lv  3 Term           2 Term           1 Term            The following portions of the patient's history were reviewed and updated as appropriate: allergies, current medications, past family history, past medical history, past social history, past surgical history, and problem list.  Review of Systems Pertinent items are noted in HPI.    Objective:  BP 135/76   Pulse (!) 46   Ht 5\' 5"  (1.651 m)   Wt 155 lb (70.3 kg)   LMP 08/27/2009   BMI 25.79 kg/m  General Appearance:    Alert, cooperative, no distress, appears stated age  Head:    Normocephalic, without obvious abnormality, atraumatic  Eyes:    conjunctiva/corneas clear, EOM's intact, both eyes  Ears:    Normal external ear canals, both ears  Nose:   Nares normal, septum midline, mucosa normal, no drainage    or sinus tenderness  Throat:   Lips, mucosa, and tongue normal; teeth and gums normal  Neck:   Supple, symmetrical, trachea midline, no adenopathy;    thyroid:  no enlargement/tenderness/nodules  Back:     Symmetric, no curvature, ROM normal, no CVA tenderness  Lungs:     respirations unlabored  Chest Wall:    No tenderness or deformity   Heart:    Regular rate and rhythm  Breast Exam:    No tenderness, masses, or  nipple abnormality  Abdomen:     Soft, non-tender, bowel sounds active all four quadrants,    no masses, no organomegaly  Genitalia:    Normal female without lesion, discharge or tenderness     Extremities:   Extremities normal, atraumatic, no cyanosis or edema  Pulses:   2+ and symmetric all extremities  Skin:   Skin color, texture, turgor normal, no rashes or lesions     Assessment:    Healthy female exam.  Female SUI H/o PMPB s/p hysteroscopy and ablation- pt instructed to call for f/u if sx returned.      Plan:   Diagnoses and all orders for this visit:  Well woman exam -     Cytology - PAP( [facility])  Rec decrease caffeine- recommend switch to noncaffeinated tea Kegels- info on MyChart  F/u in 1 year or sooner prn   Brendan Gruwell L. Harraway-Smith, M.D., Evern Core

## 2023-05-28 LAB — CYTOLOGY - PAP
Comment: NEGATIVE
High risk HPV: NEGATIVE

## 2023-06-28 ENCOUNTER — Other Ambulatory Visit (HOSPITAL_BASED_OUTPATIENT_CLINIC_OR_DEPARTMENT_OTHER): Payer: Self-pay

## 2023-07-02 ENCOUNTER — Ambulatory Visit: Payer: Commercial Managed Care - PPO

## 2023-07-08 NOTE — Progress Notes (Unsigned)
Krista Schwartz is a 67 y.o. female presents to the office today for Shingrix #2 injection, per physician's orders. Original order: 02/04/2023 Shingrix 0.5 mL, IM was administered L Deltoid  today. Patient tolerated injection. Patient due for follow up labs/provider appt: No.  Patient next injection due: n/a- series complete  Creft, Feliberto Harts   DOD: Laury Axon

## 2023-07-09 ENCOUNTER — Ambulatory Visit (INDEPENDENT_AMBULATORY_CARE_PROVIDER_SITE_OTHER): Payer: Commercial Managed Care - PPO

## 2023-07-09 DIAGNOSIS — Z23 Encounter for immunization: Secondary | ICD-10-CM | POA: Diagnosis not present

## 2023-07-11 ENCOUNTER — Encounter: Payer: Self-pay | Admitting: Family Medicine

## 2023-07-12 ENCOUNTER — Ambulatory Visit: Payer: Commercial Managed Care - PPO | Admitting: Family

## 2023-07-12 ENCOUNTER — Encounter: Payer: Self-pay | Admitting: Family

## 2023-07-12 ENCOUNTER — Other Ambulatory Visit (HOSPITAL_BASED_OUTPATIENT_CLINIC_OR_DEPARTMENT_OTHER): Payer: Self-pay

## 2023-07-12 VITALS — BP 122/78 | HR 56 | Temp 98.3°F | Resp 18 | Ht 65.0 in | Wt 156.8 lb

## 2023-07-12 DIAGNOSIS — T8069XA Other serum reaction due to other serum, initial encounter: Secondary | ICD-10-CM

## 2023-07-12 MED ORDER — HYDROXYZINE PAMOATE 25 MG PO CAPS
25.0000 mg | ORAL_CAPSULE | Freq: Three times a day (TID) | ORAL | 0 refills | Status: DC | PRN
Start: 2023-07-12 — End: 2024-06-11
  Filled 2023-07-12: qty 30, 10d supply, fill #0

## 2023-07-12 MED ORDER — FAMOTIDINE 20 MG PO TABS
20.0000 mg | ORAL_TABLET | Freq: Two times a day (BID) | ORAL | 0 refills | Status: DC
Start: 2023-07-12 — End: 2024-06-11
  Filled 2023-07-12: qty 20, 10d supply, fill #0

## 2023-07-12 NOTE — Telephone Encounter (Signed)
Appt today w/ Vernona Rieger

## 2023-07-12 NOTE — Progress Notes (Signed)
Krista Schwartz is a 67 y.o. female with the following history as recorded in EpicCare:  Patient Active Problem List   Diagnosis Date Noted   History of skin cancer 06/14/2016   Post-menopausal bleeding 06/15/2013   Breast lipoma 06/15/2013    Current Outpatient Medications  Medication Sig Dispense Refill   cetirizine (ZYRTEC) 10 MG tablet Take 10 mg by mouth at bedtime.     Cholecalciferol (VITAMIN D3) 5000 units TABS Take 1 tablet by mouth every other day.     estradiol (VIVELLE-DOT) 0.05 MG/24HR patch Place 1 patch (0.05 mg total) onto the skin 2 (two) times a week. 24 patch 6   famotidine (PEPCID) 20 MG tablet Take 1 tablet (20 mg total) by mouth 2 (two) times daily. 20 tablet 0   hydrOXYzine (VISTARIL) 25 MG capsule Take 1 capsule (25 mg total) by mouth every 8 (eight) hours as needed for itching. 30 capsule 0   medroxyPROGESTERone (PROVERA) 2.5 MG tablet Take 1 tablet (2.5 mg total) by mouth daily. Days 1-5 of the month. 5 tablet 4   Multiple Vitamin (MULTIVITAMIN) tablet Take 1 tablet by mouth daily.     Magnesium 250 MG TABS Take by mouth. Patient takes this medication every other day.     No current facility-administered medications for this visit.    Allergies: Patient has no known allergies.  Past Medical History:  Diagnosis Date   Cancer (HCC)    basal and squamous cell   Chicken pox    Complication of anesthesia 1994   tubal ligation slow to awaken and bp dropped went home after surgery   Cough 08/25/2020   last 2 weeks spoke with med md telehealth 08-23-2020 on azithromycin 250 qd x 5 days, nonproductive cough   COVID 05/2019   headache and faigue x 4 -5 days all symptoms resolved   GERD (gastroesophageal reflux disease)    no current meds taken   H/O seasonal allergies    Heart palpitations occ since sept 2021   monitor done nov 2021 results not back yet   Migraines    none in several yrs   PMB (postmenopausal bleeding) 07/2020   Wears glasses     Past  Surgical History:  Procedure Laterality Date   BASAL CELL CARCINOMA EXCISION     jan 2021 mohs procedure lip mohs also done 8- 10 yrs ago on lip on other side   DILITATION & CURRETTAGE/HYSTROSCOPY WITH NOVASURE ABLATION N/A 08/30/2020   Procedure: DILATATION & CURETTAGE/HYSTEROSCOPY WITH NOVASURE ABLATION; POLYPECTOMY WITH MYOSURE AND PERICERVICAL BLOCK;  Surgeon: Willodean Rosenthal, MD;  Location: [facility];  Service: Gynecology;  Laterality: N/A;   TUBAL LIGATION  1994   WISDOM TOOTH EXTRACTION  yrs ago    Family History  Problem Relation Age of Onset   Parkinson's disease Mother    Heart attack Father    Cancer Paternal Grandfather        skin   Cancer Maternal Grandmother        skin   Hypertension Other    Hyperlipidemia Other     Social History   Tobacco Use   Smoking status: Never   Smokeless tobacco: Never  Substance Use Topics   Alcohol use: Yes    Alcohol/week: 0.0 standard drinks of alcohol    Comment: rare    Subjective:   Received Shingrix vaccine earlier this week- was given in the left deltoid; is concerned that area where vaccine was given is "hot to the  touch." No shortness of breath/ no difficulty breathing; does take Zyrtec daily- took last night; has been watching redness on arm and is concerned that appears to be spreading;   Objective:  Vitals:   07/12/23 0835  BP: 122/78  Pulse: (!) 56  Resp: 18  Temp: 98.3 F (36.8 C)  TempSrc: Oral  SpO2: 96%  Weight: 156 lb 12.8 oz (71.1 kg)  Height: 5\' 5"  (1.651 m)    General: Well developed, well nourished, in no acute distress  Skin : Warm and dry. Localized area of erythema at site of injection- no warmth or streaking; Head: Normocephalic and atraumatic  Eyes: Sclera and conjunctiva clear; pupils round and reactive to light; extraocular movements intact  Lungs: Respirations unlabored;  Neurologic: Alert and oriented; speech intact; face symmetrical; moves all extremities well;  CNII-XII intact without focal deficit   Assessment:  1. Allergic reaction to vaccine     Plan:  Reassurance- do not see any concerns for infection; stressed need to apply ice to affected area; will use short course of Hydroxyzine and Pepcid to treat; she will follow up worse, no better.   No follow-ups on file.  No orders of the defined types were placed in this encounter.   Requested Prescriptions   Signed Prescriptions Disp Refills   hydrOXYzine (VISTARIL) 25 MG capsule 30 capsule 0    Sig: Take 1 capsule (25 mg total) by mouth every 8 (eight) hours as needed for itching.   famotidine (PEPCID) 20 MG tablet 20 tablet 0    Sig: Take 1 tablet (20 mg total) by mouth 2 (two) times daily.

## 2023-07-12 NOTE — Telephone Encounter (Signed)
Caller Name Alveria Wolanski Caller Phone Number [phone] Patient Name Krista Schwartz Patient DOB 1956-08-27 Call Type Message Only Information Provided Reason for Call Request to Schedule Office Appointment Initial Comment Caller received a shingles shot on Tuesday morning. The vaccination site is swelling. She marked it last night and the swelling has increased since then. Area is hot to the touch. Caller was told to call and make an appt. Patient request to speak to RN No Additional Comment Declined nurse triage. Office hours provided. Disp. Time Disposition Final User 07/12/2023 7:42:10 AM General Information Provided Yes Mauk-Olson, Camille Call Closed By: Bettye Boeck Transaction Date/Time: 07/12/2023 7:40:22 AM (ET)

## 2023-07-25 ENCOUNTER — Encounter (HOSPITAL_BASED_OUTPATIENT_CLINIC_OR_DEPARTMENT_OTHER): Payer: Self-pay

## 2023-07-26 ENCOUNTER — Other Ambulatory Visit (HOSPITAL_BASED_OUTPATIENT_CLINIC_OR_DEPARTMENT_OTHER): Payer: Self-pay

## 2023-07-29 ENCOUNTER — Other Ambulatory Visit (HOSPITAL_COMMUNITY): Payer: Self-pay

## 2023-08-09 ENCOUNTER — Other Ambulatory Visit: Payer: Self-pay

## 2023-08-09 DIAGNOSIS — N951 Menopausal and female climacteric states: Secondary | ICD-10-CM

## 2023-08-09 DIAGNOSIS — N858 Other specified noninflammatory disorders of uterus: Secondary | ICD-10-CM

## 2023-08-09 DIAGNOSIS — Z79890 Hormone replacement therapy (postmenopausal): Secondary | ICD-10-CM

## 2023-08-09 MED ORDER — ESTRADIOL 0.05 MG/24HR TD PTTW: 1.0000 | MEDICATED_PATCH | TRANSDERMAL | 6 refills | Status: DC

## 2023-09-11 ENCOUNTER — Other Ambulatory Visit: Payer: Self-pay | Admitting: Medical Genetics

## 2023-09-30 ENCOUNTER — Encounter: Payer: Self-pay | Admitting: Obstetrics & Gynecology

## 2023-09-30 ENCOUNTER — Other Ambulatory Visit: Payer: Self-pay | Admitting: Obstetrics & Gynecology

## 2023-09-30 ENCOUNTER — Other Ambulatory Visit (HOSPITAL_BASED_OUTPATIENT_CLINIC_OR_DEPARTMENT_OTHER): Payer: Self-pay

## 2023-09-30 DIAGNOSIS — Z79890 Hormone replacement therapy (postmenopausal): Secondary | ICD-10-CM

## 2023-09-30 DIAGNOSIS — N858 Other specified noninflammatory disorders of uterus: Secondary | ICD-10-CM

## 2023-10-01 ENCOUNTER — Other Ambulatory Visit (HOSPITAL_BASED_OUTPATIENT_CLINIC_OR_DEPARTMENT_OTHER): Payer: Self-pay

## 2023-10-01 MED ORDER — MEDROXYPROGESTERONE ACETATE 2.5 MG PO TABS
2.5000 mg | ORAL_TABLET | Freq: Every day | ORAL | 4 refills | Status: DC
  Filled 2023-10-01: qty 5, 5d supply, fill #0
  Filled 2023-10-29: qty 5, 5d supply, fill #1
  Filled 2023-11-01 – 2023-11-04 (×2): qty 5, 5d supply, fill #2
  Filled 2023-11-11: qty 5, 5d supply, fill #3
  Filled 2023-11-15 – 2024-01-14 (×2): qty 5, 5d supply, fill #4

## 2023-10-03 ENCOUNTER — Other Ambulatory Visit (HOSPITAL_COMMUNITY): Payer: Self-pay

## 2023-10-14 ENCOUNTER — Other Ambulatory Visit (HOSPITAL_COMMUNITY)
Admission: RE | Admit: 2023-10-14 | Discharge: 2023-10-14 | Disposition: A | Discharge status: Home / Self Care | Payer: Self-pay | Source: Ambulatory Visit | Attending: Oncology | Admitting: Oncology

## 2023-10-23 LAB — GENECONNECT MOLECULAR SCREEN: Genetic Analysis Overall Interpretation: NEGATIVE

## 2023-10-30 ENCOUNTER — Other Ambulatory Visit: Payer: Self-pay

## 2023-10-31 ENCOUNTER — Other Ambulatory Visit (HOSPITAL_BASED_OUTPATIENT_CLINIC_OR_DEPARTMENT_OTHER): Payer: Self-pay

## 2023-11-01 ENCOUNTER — Other Ambulatory Visit (HOSPITAL_BASED_OUTPATIENT_CLINIC_OR_DEPARTMENT_OTHER): Payer: Self-pay

## 2023-11-01 ENCOUNTER — Other Ambulatory Visit: Payer: Self-pay | Admitting: Family

## 2023-11-15 ENCOUNTER — Other Ambulatory Visit (HOSPITAL_BASED_OUTPATIENT_CLINIC_OR_DEPARTMENT_OTHER): Payer: Self-pay

## 2024-01-20 ENCOUNTER — Other Ambulatory Visit: Payer: Self-pay

## 2024-01-27 ENCOUNTER — Other Ambulatory Visit (HOSPITAL_BASED_OUTPATIENT_CLINIC_OR_DEPARTMENT_OTHER): Payer: Self-pay

## 2024-01-28 ENCOUNTER — Other Ambulatory Visit (HOSPITAL_BASED_OUTPATIENT_CLINIC_OR_DEPARTMENT_OTHER): Payer: Self-pay

## 2024-01-28 ENCOUNTER — Other Ambulatory Visit: Payer: Self-pay | Admitting: Family Medicine

## 2024-01-28 DIAGNOSIS — Z1231 Encounter for screening mammogram for malignant neoplasm of breast: Secondary | ICD-10-CM

## 2024-02-05 ENCOUNTER — Ambulatory Visit (INDEPENDENT_AMBULATORY_CARE_PROVIDER_SITE_OTHER)

## 2024-02-05 DIAGNOSIS — Z1231 Encounter for screening mammogram for malignant neoplasm of breast: Secondary | ICD-10-CM | POA: Diagnosis not present

## 2024-02-06 ENCOUNTER — Encounter

## 2024-02-06 DIAGNOSIS — Z1231 Encounter for screening mammogram for malignant neoplasm of breast: Secondary | ICD-10-CM

## 2024-02-07 ENCOUNTER — Other Ambulatory Visit: Payer: Self-pay | Admitting: Family Medicine

## 2024-02-07 DIAGNOSIS — R928 Other abnormal and inconclusive findings on diagnostic imaging of breast: Secondary | ICD-10-CM

## 2024-02-12 ENCOUNTER — Other Ambulatory Visit (HOSPITAL_BASED_OUTPATIENT_CLINIC_OR_DEPARTMENT_OTHER): Payer: Self-pay

## 2024-02-17 ENCOUNTER — Ambulatory Visit
Admission: RE | Admit: 2024-02-17 | Discharge: 2024-02-17 | Disposition: A | Discharge status: Home / Self Care | Source: Ambulatory Visit | Attending: Family Medicine | Admitting: Family Medicine

## 2024-02-17 DIAGNOSIS — R928 Other abnormal and inconclusive findings on diagnostic imaging of breast: Secondary | ICD-10-CM

## 2024-02-20 ENCOUNTER — Encounter: Payer: Self-pay | Admitting: Obstetrics & Gynecology

## 2024-02-21 ENCOUNTER — Other Ambulatory Visit (HOSPITAL_BASED_OUTPATIENT_CLINIC_OR_DEPARTMENT_OTHER): Payer: Self-pay

## 2024-02-21 ENCOUNTER — Other Ambulatory Visit: Payer: Self-pay

## 2024-02-21 DIAGNOSIS — Z79890 Hormone replacement therapy (postmenopausal): Secondary | ICD-10-CM

## 2024-02-21 DIAGNOSIS — N858 Other specified noninflammatory disorders of uterus: Secondary | ICD-10-CM

## 2024-02-21 MED ORDER — MEDROXYPROGESTERONE ACETATE 2.5 MG PO TABS
2.5000 mg | ORAL_TABLET | Freq: Every day | ORAL | 4 refills | Status: DC
  Filled 2024-02-21: qty 5, 5d supply, fill #0

## 2024-02-25 ENCOUNTER — Other Ambulatory Visit: Payer: Self-pay | Admitting: Obstetrics & Gynecology

## 2024-02-25 DIAGNOSIS — N858 Other specified noninflammatory disorders of uterus: Secondary | ICD-10-CM

## 2024-02-25 DIAGNOSIS — Z79890 Hormone replacement therapy (postmenopausal): Secondary | ICD-10-CM

## 2024-02-25 MED ORDER — MEDROXYPROGESTERONE ACETATE 2.5 MG PO TABS: 2.5000 mg | ORAL_TABLET | Freq: Every day | ORAL | 4 refills | Status: DC

## 2024-03-02 ENCOUNTER — Ambulatory Visit
Admission: EM | Admit: 2024-03-02 | Discharge: 2024-03-02 | Disposition: A | Discharge status: Home / Self Care | Attending: Family Medicine | Admitting: Family Medicine

## 2024-03-02 ENCOUNTER — Telehealth: Payer: Self-pay

## 2024-03-02 DIAGNOSIS — R002 Palpitations: Secondary | ICD-10-CM | POA: Diagnosis not present

## 2024-03-02 DIAGNOSIS — Z85828 Personal history of other malignant neoplasm of skin: Secondary | ICD-10-CM | POA: Diagnosis not present

## 2024-03-02 DIAGNOSIS — J029 Acute pharyngitis, unspecified: Secondary | ICD-10-CM | POA: Insufficient documentation

## 2024-03-02 LAB — POCT RAPID STREP A (OFFICE): Rapid Strep A Screen: NEGATIVE

## 2024-03-02 MED ORDER — PREDNISONE 50 MG PO TABS
ORAL_TABLET | ORAL | 0 refills | Status: DC
Start: 2024-03-02 — End: 2024-03-10

## 2024-03-02 NOTE — ED Triage Notes (Signed)
 Pt presents to uc with co sore throat since last night. Pt has taken otc allegra and zertec.

## 2024-03-02 NOTE — Telephone Encounter (Signed)
 Copied from CRM [id number]. Topic: Clinical - Request for Lab/Test Order >> Mar 02, 2024 10:34 AM Revonda D wrote: Reason for CRM: Pt is requesting to have a throat culture done. Pt would like for the test to be ordered today so she can have it completed later today because she leaves out of town tomorrow. Pt would like a callback with an update on this request.   ----------------------------------------------------------------------- From previous Reason for Contact - Scheduling: Patient/patient representative is calling to schedule an appointment. Refer to attachments for appointment information.

## 2024-03-02 NOTE — Telephone Encounter (Signed)
 PCP spoke with pt, pt was advised to go to UC.

## 2024-03-02 NOTE — ED Provider Notes (Signed)
 [facility]    CSN: [id number] Arrival date & time: 03/02/24  1408      History   Chief Complaint Chief Complaint  Patient presents with   Sore Throat    HPI Krista Schwartz is a 68 y.o. female.   HPI 68 year old female presents with sore throat since last night.  PMH significant for BCC/SCC, migraines and heart palpitations.  Past Medical History:  Diagnosis Date   Cancer (HCC)    basal and squamous cell   Chicken pox    Complication of anesthesia 1994   tubal ligation slow to awaken and bp dropped went home after surgery   Cough 08/25/2020   last 2 weeks spoke with med md telehealth 08-23-2020 on azithromycin  250 qd x 5 days, nonproductive cough   COVID 05/2019   headache and faigue x 4 -5 days all symptoms resolved   GERD (gastroesophageal reflux disease)    no current meds taken   H/O seasonal allergies    Heart palpitations occ since sept 2021   monitor done nov 2021 results not back yet   Migraines    none in several yrs   PMB (postmenopausal bleeding) 07/2020   Wears glasses     Patient Active Problem List   Diagnosis Date Noted   History of skin cancer 06/14/2016   Post-menopausal bleeding 06/15/2013   Breast lipoma 06/15/2013    Past Surgical History:  Procedure Laterality Date   BASAL CELL CARCINOMA EXCISION     jan 2021 mohs procedure lip mohs also done 8- 10 yrs ago on lip on other side   DILITATION & CURRETTAGE/HYSTROSCOPY WITH NOVASURE ABLATION N/A 08/30/2020   Procedure: DILATATION & CURETTAGE/HYSTEROSCOPY WITH NOVASURE ABLATION; POLYPECTOMY WITH MYOSURE AND PERICERVICAL BLOCK;  Surgeon: Corene Coy, MD;  Location: [facility];  Service: Gynecology;  Laterality: N/A;   TUBAL LIGATION  1994   WISDOM TOOTH EXTRACTION  yrs ago    OB History     Gravida  3   Para  3   Term  3   Preterm      AB      Living  3      SAB      IAB      Ectopic      Multiple      Live Births                Home Medications    Prior to Admission medications   Medication Sig Start Date End Date Taking? Authorizing Provider  predniSONE  (DELTASONE ) 50 MG tablet Take 1 tab p.o. daily for 5 days. 03/02/24  Yes Teddy Sharper, FNP  cetirizine (ZYRTEC) 10 MG tablet Take 10 mg by mouth at bedtime.    [provider]  Cholecalciferol (VITAMIN D3) 5000 units TABS Take 1 tablet by mouth every other day.    [provider]  estradiol  (VIVELLE -DOT) 0.05 MG/24HR patch Place 1 patch (0.05 mg total) onto the skin 2 (two) times a week. 08/12/23   Corene Coy, MD  famotidine  (PEPCID ) 20 MG tablet Take 1 tablet (20 mg total) by mouth 2 (two) times daily. 07/12/23   Jason Leita Repine, FNP  hydrOXYzine  (VISTARIL ) 25 MG capsule Take 1 capsule (25 mg total) by mouth every 8 (eight) hours as needed for itching. 07/12/23   Jason Leita Repine, FNP  medroxyPROGESTERone  (PROVERA ) 2.5 MG tablet Take 1 tablet (2.5 mg total) by mouth daily. Days 1-5 of the month. 02/25/24   Harraway-Smith,  Elveria, MD  Multiple Vitamin (MULTIVITAMIN) tablet Take 1 tablet by mouth daily.    [provider]    Family History Family History  Problem Relation Age of Onset   Parkinson's disease Mother    Heart attack Father    Cancer Paternal Grandfather        skin   Cancer Maternal Grandmother        skin   Hypertension Other    Hyperlipidemia Other     Social History Social History   Tobacco Use   Smoking status: Never   Smokeless tobacco: Never  Vaping Use   Vaping status: Never Used  Substance Use Topics   Alcohol use: Yes    Alcohol/week: 0.0 standard drinks of alcohol    Comment: rare   Drug use: No     Allergies   Patient has no known allergies.   Review of Systems Review of Systems   Physical Exam Triage Vital Signs ED Triage Vitals [03/02/24 1428]  Encounter Vitals Group     BP      Girls Systolic BP Percentile      Girls Diastolic BP Percentile       Boys Systolic BP Percentile      Boys Diastolic BP Percentile      Pulse      Resp      Temp      Temp src      SpO2      Weight      Height      Head Circumference      Peak Flow      Pain Score 6     Pain Loc      Pain Education      Exclude from Growth Chart    No data found.  Updated Vital Signs BP (!) 153/86   Pulse (!) 56   Temp 98.1 F (36.7 C)   Resp 19   LMP 08/27/2009   SpO2 98%   Visual Acuity Right Eye Distance:   Left Eye Distance:   Bilateral Distance:    Right Eye Near:   Left Eye Near:    Bilateral Near:     Physical Exam Vitals and nursing note reviewed.  Constitutional:      General: She is not in acute distress.    Appearance: Normal appearance. She is well-developed and normal weight. She is not ill-appearing.  HENT:     Head: Normocephalic and atraumatic.     Right Ear: Tympanic membrane, ear canal and external ear normal.     Left Ear: Tympanic membrane, ear canal and external ear normal.     Mouth/Throat:     Mouth: Mucous membranes are moist.     Pharynx: Oropharynx is clear. Uvula midline.     Tonsils: 2+ on the right. 2+ on the left.  Eyes:     Extraocular Movements: Extraocular movements intact.     Conjunctiva/sclera: Conjunctivae normal.     Pupils: Pupils are equal, round, and reactive to light.  Cardiovascular:     Rate and Rhythm: Normal rate and regular rhythm.     Heart sounds: Normal heart sounds. No murmur heard. Pulmonary:     Effort: Pulmonary effort is normal.     Breath sounds: Normal breath sounds. No wheezing, rhonchi or rales.  Musculoskeletal:        General: Normal range of motion.     Cervical back: Normal range of motion and neck supple.  Skin:  General: Skin is warm and dry.  Neurological:     General: No focal deficit present.     Mental Status: She is alert and oriented to person, place, and time.  Psychiatric:        Mood and Affect: Mood normal.        Behavior: Behavior normal.        Thought  Content: Thought content normal.      UC Treatments / Results  Labs (all labs ordered are listed, but only abnormal results are displayed) Labs Reviewed  CULTURE, GROUP A STREP [facility])  POCT RAPID STREP A (OFFICE)    EKG   Radiology No results found.  Procedures Procedures (including critical care time)  Medications Ordered in UC Medications - No data to display  Initial Impression / Assessment and Plan / UC Course  I have reviewed the triage vital signs and the nursing notes.  Pertinent labs & imaging results that were available during my care of the patient were reviewed by me and considered in my medical decision making (see chart for details).     MDM: 1.  Sore throat-rapid strep negative, Rx'd prednisone  50 mg tablet: Take 1 tablet p.o. daily x 5 days, throat culture ordered. Advised patient take medication as directed with food to completion.  Encouraged to increase daily water intake to 64 ounces per day while taking this medication.  Advised we will follow-up with throat culture results once received.  Advised if symptoms worsen and/or unresolved please follow-up with your PCP or here for further evaluation.  Patient discharged home, hemodynamically stable. Final Clinical Impressions(s) / UC Diagnoses   Final diagnoses:  Sore throat     Discharge Instructions      Advised patient take medication as directed with food to completion.  Encouraged to increase daily water intake to 64 ounces per day while taking this medication.  Advised we will follow-up with throat culture results once received.  Advised if symptoms worsen and/or unresolved please follow-up with your PCP or here for further evaluation.     ED Prescriptions     Medication Sig Dispense Auth. Provider   predniSONE  (DELTASONE ) 50 MG tablet Take 1 tab p.o. daily for 5 days. 5 tablet Tim Corriher, FNP      PDMP not reviewed this encounter.   Teddy Sharper, FNP 03/02/24 1451

## 2024-03-02 NOTE — Discharge Instructions (Addendum)
 Advised patient take medication as directed with food to completion.  Encouraged to increase daily water intake to 64 ounces per day while taking this medication.  Advised we will follow-up with throat culture results once received.  Advised if symptoms worsen and/or unresolved please follow-up with your PCP or here for further evaluation.

## 2024-03-04 ENCOUNTER — Telehealth: Payer: Self-pay | Admitting: Emergency Medicine

## 2024-03-04 NOTE — Telephone Encounter (Signed)
 Patient called regarding her strep culture results.  Advised that the results are not available.  Patient will continue to check mychart for updates.

## 2024-03-05 LAB — CULTURE, GROUP A STREP (THRC)

## 2024-03-10 ENCOUNTER — Encounter: Payer: Self-pay | Admitting: Family Medicine

## 2024-03-10 ENCOUNTER — Ambulatory Visit: Admitting: Family Medicine

## 2024-03-10 ENCOUNTER — Other Ambulatory Visit (HOSPITAL_BASED_OUTPATIENT_CLINIC_OR_DEPARTMENT_OTHER): Payer: Self-pay

## 2024-03-10 VITALS — BP 122/66 | HR 68 | Temp 98.0°F | Resp 16 | Ht 65.0 in | Wt 156.0 lb

## 2024-03-10 DIAGNOSIS — J0100 Acute maxillary sinusitis, unspecified: Secondary | ICD-10-CM | POA: Diagnosis not present

## 2024-03-10 MED ORDER — AMOXICILLIN-POT CLAVULANATE 875-125 MG PO TABS
1.0000 | ORAL_TABLET | Freq: Two times a day (BID) | ORAL | 0 refills | Status: AC
  Filled 2024-03-10: qty 14, 7d supply, fill #0

## 2024-03-10 MED ORDER — FLUCONAZOLE 150 MG PO TABS
150.0000 mg | ORAL_TABLET | Freq: Every day | ORAL | 0 refills | Status: DC
  Filled 2024-03-10: qty 2, 3d supply, fill #0

## 2024-03-10 NOTE — Progress Notes (Signed)
 Chief Complaint  Patient presents with   Fatigue    Fatigue and Congestion/Cough    Krista Schwartz here for URI complaints.  Duration: 9 days - worsening around 3 d ago Associated symptoms: sinus headache, sinus congestion, sinus pain, rhinorrhea, sore throat, chest tightness, and dry cough Denies: itchy watery eyes, ear pain, ear drainage, wheezing, shortness of breath, myalgia, and fevers, dental pain Treatment to date: Tessalon  Perles, Allegra, prednisone , Tylenol  Sick contacts: No  Past Medical History:  Diagnosis Date   Cancer (HCC)    basal and squamous cell   Chicken pox    Complication of anesthesia 1994   tubal ligation slow to awaken and bp dropped went home after surgery   COVID 05/2019   headache and faigue x 4 -5 days all symptoms resolved   GERD (gastroesophageal reflux disease)    no current meds taken   H/O seasonal allergies    Heart palpitations occ since sept 2021   monitor done nov 2021 results not back yet   Migraines    none in several yrs   PMB (postmenopausal bleeding) 07/2020   Wears glasses     Objective BP 122/66 (BP Location: Left Arm, Cuff Size: Normal)   Pulse 68   Temp 98 F (36.7 C) (Oral)   Resp 16   Ht 5' 5 (1.651 m)   Wt 156 lb (70.8 kg)   LMP 08/27/2009   SpO2 97%   BMI 25.96 kg/m  General: Awake, alert, appears stated age HEENT: AT, [place], ears patent b/l and TM's neg, nares patent w/o discharge, pharynx pink and without exudates, MMM, TTP over maxillary sinuses bilaterally Neck: No masses or asymmetry Heart: RRR Lungs: CTAB, no accessory muscle use Psych: Age appropriate judgment and insight, normal mood and affect  Acute maxillary sinusitis, recurrence not specified - Plan: amoxicillin -clavulanate (AUGMENTIN ) 875-125 MG tablet, fluconazole  (DIFLUCAN ) 150 MG tablet  Worsening for 3 days.  Will do 7 days of Augmentin  twice daily.  Diflucan  should she develop a yeast infection.  Send message in a couple days if not  significantly improved.  Continue to push fluids, practice good hand hygiene, cover mouth when coughing. F/u prn. If starting to experience fevers, shaking, or shortness of breath, seek immediate care. Pt voiced understanding and agreement to the plan.  Mabel [place], DO 03/10/24 2:11 PM

## 2024-03-10 NOTE — Patient Instructions (Signed)
OK to take Tylenol 1000 mg (2 extra strength tabs) or 975 mg (3 regular strength tabs) every 6 hours as needed.  Continue to push fluids, practice good hand hygiene, and cover your mouth if you cough.  If you start having fevers, shaking or shortness of breath, seek immediate care.  Let us know if you need anything.

## 2024-06-11 ENCOUNTER — Other Ambulatory Visit: Payer: Self-pay | Admitting: Obstetrics and Gynecology

## 2024-06-11 ENCOUNTER — Other Ambulatory Visit (HOSPITAL_COMMUNITY)
Admission: RE | Admit: 2024-06-11 | Discharge: 2024-06-11 | Disposition: A | Discharge status: Home / Self Care | Source: Ambulatory Visit | Attending: Obstetrics and Gynecology | Admitting: Obstetrics and Gynecology

## 2024-06-11 ENCOUNTER — Ambulatory Visit (INDEPENDENT_AMBULATORY_CARE_PROVIDER_SITE_OTHER): Admitting: Obstetrics and Gynecology

## 2024-06-11 ENCOUNTER — Encounter: Payer: Self-pay | Admitting: Obstetrics and Gynecology

## 2024-06-11 VITALS — BP 130/80 | HR 46 | Ht 65.0 in | Wt 155.0 lb

## 2024-06-11 DIAGNOSIS — Z124 Encounter for screening for malignant neoplasm of cervix: Secondary | ICD-10-CM

## 2024-06-11 DIAGNOSIS — Z23 Encounter for immunization: Secondary | ICD-10-CM

## 2024-06-11 DIAGNOSIS — N950 Postmenopausal bleeding: Secondary | ICD-10-CM | POA: Diagnosis not present

## 2024-06-11 DIAGNOSIS — Z01419 Encounter for gynecological examination (general) (routine) without abnormal findings: Secondary | ICD-10-CM | POA: Insufficient documentation

## 2024-06-11 DIAGNOSIS — Z780 Asymptomatic menopausal state: Secondary | ICD-10-CM

## 2024-06-11 DIAGNOSIS — Z13820 Encounter for screening for osteoporosis: Secondary | ICD-10-CM

## 2024-06-11 NOTE — Addendum Note (Signed)
 Addended by: ORLINDA SILVANO ORN on: 06/11/2024 09:48 AM   Modules accepted: Orders

## 2024-06-11 NOTE — Patient Instructions (Signed)
 It was nice meeting you today! You will see your results in the MyChart app within 1 week  Please keep me updated on how you feel off the hormone therapy and if you keep having bleeding. I will also follow up with you after your ultrasound.

## 2024-06-11 NOTE — Progress Notes (Signed)
 ANNUAL EXAM Patient name: Krista Schwartz MRN [id number]  Date of birth: 15-Jan-1956 Chief Complaint:   Gynecologic Exam (Pt reported still having bleeding between 1-3 days after taking Provera , has been since several years. )  History of Present Illness:   Krista Schwartz is a 68 y.o. [phone] with Patient's last menstrual period was 08/27/2009. being seen today for a routine annual exam.  Current complaints: Bleeding after progesterone  portion of HT  Last pap 05/22/23. Results were: LSIL w/ HRHPV negative. Last mammogram: 02/05/24. Results were: BIRADS 2 Last colonoscopy: Cologuard 2023 DEXA: never     05/22/2023    4:47 PM 02/04/2023    9:00 AM 01/10/2022    9:38 AM 01/09/2021   10:41 AM 12/23/2019    9:24 AM  Depression screen PHQ 2/9  Decreased Interest 0 0 0 0 0  Down, Depressed, Hopeless 0 0 0 0 0  PHQ - 2 Score 0 0 0 0 0  Altered sleeping 1      Tired, decreased energy 1      Change in appetite 0      Feeling bad or failure about yourself  0      Trouble concentrating 0      Moving slowly or fidgety/restless 0      Suicidal thoughts 0      PHQ-9 Score 2            05/22/2023    4:47 PM  GAD 7 : Generalized Anxiety Score  Nervous, Anxious, on Edge 1  Control/stop worrying 0  Worry too much - different things 0  Trouble relaxing 0  Restless 0  Easily annoyed or irritable 0  Afraid - awful might happen 0  Total GAD 7 Score 1     Review of Systems:   Pertinent items are noted in HPI Denies any headaches, blurred vision, fatigue, shortness of breath, chest pain, abdominal pain, abnormal vaginal discharge/itching/odor/irritation, problems with periods, bowel movements, urination, or intercourse unless otherwise stated above. Pertinent History Reviewed:  Reviewed past medical,surgical, social and family history.  Reviewed problem list, medications and allergies. Physical Assessment:   Vitals:   06/11/24 0853  BP: 130/80  Pulse: (!) 46  Weight: 155 lb  (70.3 kg)  Height: 5' 5 (1.651 m)  Body mass index is 25.79 kg/m.        Physical Examination:   General appearance - well appearing, and in no distress  Mental status - alert, oriented to person, place, and time  Chest - respiratory effort normal  Heart - normal peripheral perfusion  Breasts - breasts appear normal, no suspicious masses, no skin or nipple changes or axillary nodes. Known lipoma in right breast. Fibrocystic change in left breast at 8 o'clock, will monitor  Abdomen - soft, nontender, nondistended, no masses or organomegaly  Pelvic - VULVA: normal appearing vulva with no masses, tenderness or lesions  VAGINA: normal appearing vagina with normal color and discharge, no lesions  CERVIX: normal appearing cervix without discharge or lesions, no CMT  Thin prep pap is done with HR HPV cotesting  UTERUS: uterus is felt to be normal size, shape, consistency and nontender   ADNEXA: No adnexal masses or tenderness noted.  Chaperone present for exam  No results found for this or any previous visit (from the past 24 hours).  Assessment & Plan:  1) Well-Woman Exam Mammogram: in 1 year, or sooner if problems Colonoscopy: Cologuard due 2026 Pap: Collected DEXA ordered Offered HIV screening (none  in chart) - declines for now, reports negative testing in adult lifetime (with pregnancies) Flu shot today  2) Postmenopausal bleeding - Discussed stopping vs changing progesterone  component of HT. Will stop for now - Pelvic US  to assess endometrial stripe - Will need sampling if bleeding persists   Labs/procedures today:   Orders Placed This Encounter  Procedures   DG Bone Density   US  PELVIC COMPLETE WITH TRANSVAGINAL   Meds: No orders of the defined types were placed in this encounter.  Follow-up: Return in about 1 year (around 06/11/2025) for annual exam or sooner as needed.  Kieth JAYSON Carolin, MD 06/11/2024 9:30 AM

## 2024-06-12 ENCOUNTER — Ambulatory Visit

## 2024-06-12 DIAGNOSIS — N950 Postmenopausal bleeding: Secondary | ICD-10-CM | POA: Diagnosis not present

## 2024-06-15 LAB — CYTOLOGY - PAP
Comment: NEGATIVE
Diagnosis: NEGATIVE
Diagnosis: REACTIVE
High risk HPV: NEGATIVE

## 2024-06-17 ENCOUNTER — Ambulatory Visit: Payer: Self-pay | Admitting: Obstetrics and Gynecology

## 2024-07-17 ENCOUNTER — Ambulatory Visit (INDEPENDENT_AMBULATORY_CARE_PROVIDER_SITE_OTHER)

## 2024-07-17 DIAGNOSIS — Z13820 Encounter for screening for osteoporosis: Secondary | ICD-10-CM

## 2024-07-17 DIAGNOSIS — Z780 Asymptomatic menopausal state: Secondary | ICD-10-CM

## 2024-07-22 ENCOUNTER — Ambulatory Visit: Payer: Self-pay | Admitting: Obstetrics and Gynecology

## 2024-07-22 DIAGNOSIS — M8580 Other specified disorders of bone density and structure, unspecified site: Secondary | ICD-10-CM | POA: Insufficient documentation

## 2024-07-29 ENCOUNTER — Ambulatory Visit: Admitting: Obstetrics and Gynecology

## 2024-07-29 ENCOUNTER — Other Ambulatory Visit (HOSPITAL_COMMUNITY)
Admission: RE | Admit: 2024-07-29 | Discharge: 2024-07-29 | Disposition: A | Discharge status: Home / Self Care | Source: Ambulatory Visit | Attending: Obstetrics and Gynecology | Admitting: Obstetrics and Gynecology

## 2024-07-29 VITALS — BP 132/79 | HR 49 | Wt 155.0 lb

## 2024-07-29 DIAGNOSIS — N950 Postmenopausal bleeding: Secondary | ICD-10-CM | POA: Insufficient documentation

## 2024-07-29 NOTE — Patient Instructions (Signed)
 It is normal to have cramping and bleeding for the next 2-3 days. You should feel better every day.  Please call us  if you have any severe pain, bleeding that soaks more than 1 pad in a hour, have fevers, or feel like you're going to pass out.  You can take tylenol  1000mg  every 8 hours and ibuprofen  800mg  every 8 hours as needed for pain. It is ok to take both at the same time.    We will work to get your Veozah approved or covered. Please get your baseline testing (liver enzymes) done in the meantime.

## 2024-07-30 NOTE — Progress Notes (Signed)
      GYNECOLOGY OFFICE PROCEDURE NOTE   Krista Schwartz is a 68 y.o. [phone] here for endometrial biopsy for PMB on MHT. Recent ultrasound also showed EL 7.46mm. Has hx endometrial ablation for PMB on MHT. No bleeding since stopping HT.    ENDOMETRIAL BIOPSY     The indications for endometrial biopsy were reviewed.   Risks of the biopsy including cramping, bleeding, infection, uterine perforation, inadequate specimen and need for additional procedures were discussed. Offered alternative of hysteroscopy, dilation and curettage in OR. The patient states she understands the R/B/I/A and agrees to undergo procedure today. Urine pregnancy test was Not indicated. Consent was signed. Time out was performed.    Patient was positioned in dorsal lithotomy position. A vaginal speculum was placed.  The cervix was visualized and was prepped with Betadine . 3cc of 1% lidocaine  was injected into the cervix at 12 o'clock.  A single-toothed tenaculum was placed on the anterior lip of the cervix to stabilize it. Os finders used to dilate the cervix. The 3 mm pipelle was introduced to a depth of 4-5 cm, and a Scant amount of tissue was obtained after two passes and sent to pathology. Suspect it will be endocervical tissue. The instruments were removed from the patient's vagina. Minimal bleeding from the cervix was noted. The patient tolerated the procedure well.   Patient was given post procedure instructions.  Will follow up pathology and manage accordingly; patient will be contacted with results and recommendations.  Routine preventative health maintenance measures emphasized.   Kieth Carolin, MD Obstetrician & Gynecologist, [facility] for [employer], [facility]

## 2024-07-30 NOTE — Addendum Note (Signed)
 Addended by: ERIK FELTS on: 07/30/2024 01:03 PM   Modules accepted: Orders

## 2024-07-31 ENCOUNTER — Ambulatory Visit: Payer: Self-pay | Admitting: Obstetrics and Gynecology

## 2024-07-31 LAB — SURGICAL PATHOLOGY

## 2024-08-01 LAB — COMPREHENSIVE METABOLIC PANEL WITH GFR
ALT: 10 IU/L (ref 0–32)
AST: 18 IU/L (ref 0–40)
Albumin: 4.5 g/dL (ref 3.9–4.9)
Alkaline Phosphatase: 93 IU/L (ref 49–135)
BUN/Creatinine Ratio: 17 (ref 12–28)
BUN: 15 mg/dL (ref 8–27)
Bilirubin Total: 0.2 mg/dL (ref 0.0–1.2)
CO2: 25 mmol/L (ref 20–29)
Calcium: 9.5 mg/dL (ref 8.7–10.3)
Chloride: 101 mmol/L (ref 96–106)
Creatinine, Ser: 0.9 mg/dL (ref 0.57–1.00)
Globulin, Total: 2.1 g/dL (ref 1.5–4.5)
Glucose: 87 mg/dL (ref 70–99)
Potassium: 4.1 mmol/L (ref 3.5–5.2)
Sodium: 143 mmol/L (ref 134–144)
Total Protein: 6.6 g/dL (ref 6.0–8.5)
eGFR: 71 mL/min/1.73 (ref >59)

## 2024-08-01 LAB — CBC
Hematocrit: 42.6 % (ref 34.0–46.6)
Hemoglobin: 14.2 g/dL (ref 11.1–15.9)
MCH: 29.6 pg (ref 26.6–33.0)
MCHC: 33.3 g/dL (ref 31.5–35.7)
MCV: 89 fL (ref 79–97)
Platelets: 307 x10E3/uL (ref 150–450)
RBC: 4.79 x10E6/uL (ref 3.77–5.28)
RDW: 12 % (ref 11.7–15.4)
WBC: 7.5 x10E3/uL (ref 3.4–10.8)

## 2024-08-03 ENCOUNTER — Other Ambulatory Visit: Payer: Self-pay | Admitting: Obstetrics and Gynecology

## 2024-08-03 MED ORDER — VEOZAH 45 MG PO TABS: 1.0000 | ORAL_TABLET | Freq: Every day | ORAL | 11 refills | Status: AC

## 2024-08-12 ENCOUNTER — Other Ambulatory Visit (HOSPITAL_BASED_OUTPATIENT_CLINIC_OR_DEPARTMENT_OTHER): Payer: Self-pay

## 2024-08-12 ENCOUNTER — Ambulatory Visit: Admitting: Medical

## 2024-08-12 VITALS — BP 140/82 | HR 57 | Temp 98.3°F | Resp 16 | Ht 65.0 in | Wt 155.8 lb

## 2024-08-12 DIAGNOSIS — R051 Acute cough: Secondary | ICD-10-CM | POA: Diagnosis not present

## 2024-08-12 DIAGNOSIS — J029 Acute pharyngitis, unspecified: Secondary | ICD-10-CM

## 2024-08-12 DIAGNOSIS — M940 Chondrocostal junction syndrome [Tietze]: Secondary | ICD-10-CM | POA: Diagnosis not present

## 2024-08-12 DIAGNOSIS — R0789 Other chest pain: Secondary | ICD-10-CM

## 2024-08-12 DIAGNOSIS — J40 Bronchitis, not specified as acute or chronic: Secondary | ICD-10-CM | POA: Diagnosis not present

## 2024-08-12 LAB — POCT INFLUENZA A/B
Influenza A, POC: NEGATIVE
Influenza B, POC: NEGATIVE

## 2024-08-12 LAB — POCT RAPID STREP A (OFFICE): Rapid Strep A Screen: NEGATIVE

## 2024-08-12 LAB — POC COVID19 BINAXNOW: SARS Coronavirus 2 Ag: NEGATIVE

## 2024-08-12 MED ORDER — AZITHROMYCIN 250 MG PO TABS
ORAL_TABLET | ORAL | 0 refills | Status: AC
  Filled 2024-08-12: qty 6, 5d supply, fill #0

## 2024-08-12 MED FILL — Benzonatate Cap 100 MG: 100.0000 mg | ORAL | 10 days supply | Qty: 30 | Fill #0 | Status: AC

## 2024-08-12 NOTE — Progress Notes (Signed)
 Subjective:    Patient ID: Krista Schwartz, female    DOB: 08-21-1956, 68 y.o.   MRN: [id number]  HPI Krista Schwartz is a 68 year old female who presents with a sore throat and hoarse voice.  She has had a persistent sore throat and raspy, hoarse voice since Sunday, after several days of milder intermittent symptoms last week. She has a dry cough without nasal congestion, fever, chills, or sweats related to the illness.  She notes intermittent chest pressure starting Monday, lasting about an hour at a time, described as difficulty taking a full deep breath(no reoccurence since Monday and no associated cardiac like signs/symptoms). She denies wheezing, productive cough, jaw pain, or shoulder pain. She feels pressure in her cheeks and maxillary sinuses.  She stopped hormone therapy a month ago and has had night sweats since then. She does not smoke and rarely drinks alcohol.      Review of Systems  Constitutional:  Negative for diaphoresis and fever.       Sweats since hormone supplamentation dc'd.  HENT:  Positive for congestion, sore throat and voice change.   Respiratory:  Positive for cough. Negative for apnea and wheezing.        Dry cough  Cardiovascular:  Negative for chest pain and palpitations.  Gastrointestinal:  Negative for abdominal pain.  Genitourinary:  Negative for dyspareunia, flank pain and frequency.  Musculoskeletal:  Negative for back pain, myalgias and neck stiffness.  Skin:  Negative for rash.  Neurological:  Negative for dizziness, speech difficulty and light-headedness.  Hematological:  Negative for adenopathy.  Psychiatric/Behavioral:  Negative for behavioral problems and decreased concentration.     Past Medical History:  Diagnosis Date   Cancer (HCC)    basal and squamous cell   Chicken pox    Complication of anesthesia 1994   tubal ligation slow to awaken and bp dropped went home after surgery   COVID 05/2019   headache and faigue x 4  -5 days all symptoms resolved   GERD (gastroesophageal reflux disease)    no current meds taken   H/O seasonal allergies    Heart palpitations occ since sept 2021   monitor done nov 2021 results not back yet   Migraines    none in several yrs   PMB (postmenopausal bleeding) 07/2020   Wears glasses      Social History   Socioeconomic History   Marital status: Married    Spouse name: Not on file   Number of children: Not on file   Years of education: Not on file   Highest education level: Not on file  Occupational History   Occupation: runner, broadcasting/film/video  Tobacco Use   Smoking status: Never   Smokeless tobacco: Never  Vaping Use   Vaping status: Never Used  Substance and Sexual Activity   Alcohol use: Not Currently    Comment: rare   Drug use: No   Sexual activity: Not Currently    Partners: Male    Birth control/protection: Post-menopausal  Other Topics Concern   Not on file  Social History Narrative   Not on file   Social Drivers of Health   Tobacco Use: Low Risk (06/11/2024)   Patient History    Smoking Tobacco Use: Never    Smokeless Tobacco Use: Never    Passive Exposure: Not on file  Financial Resource Strain: Not on file  Food Insecurity: Not on file  Transportation Needs: Not on file  Physical Activity: Not on file  Stress: Not on file  Social Connections: Not on file  Intimate Partner Violence: Not on file  Depression (PHQ2-9): Low Risk (08/12/2024)   Depression (PHQ2-9)    PHQ-2 Score: 2  Alcohol Screen: Not on file  Housing: Not on file  Utilities: Not on file  Health Literacy: Not on file    Past Surgical History:  Procedure Laterality Date   BASAL CELL CARCINOMA EXCISION     jan 2021 mohs procedure lip mohs also done 8- 10 yrs ago on lip on other side   DILITATION & CURRETTAGE/HYSTROSCOPY WITH NOVASURE ABLATION N/A 08/30/2020   Procedure: DILATATION & CURETTAGE/HYSTEROSCOPY WITH NOVASURE ABLATION; POLYPECTOMY WITH MYOSURE AND PERICERVICAL BLOCK;   Surgeon: Corene Coy, MD;  Location: [facility];  Service: Gynecology;  Laterality: N/A;   TUBAL LIGATION  1994   WISDOM TOOTH EXTRACTION  yrs ago    Family History  Problem Relation Age of Onset   Parkinson's disease Mother    Heart attack Father    Cancer Paternal Grandfather        skin   Cancer Maternal Grandmother        skin   Hypertension Other    Hyperlipidemia Other     Allergies[1]  Medications Ordered Prior to Encounter[2]  BP (!) 140/82   Pulse (!) 57   Temp 98.3 F (36.8 C) (Oral)   Resp 16   Ht 5' 5 (1.651 m)   Wt 155 lb 12.8 oz (70.7 kg)   LMP 08/27/2009   SpO2 99%   BMI 25.93 kg/m         Objective:   Physical Exam  General Mental Status- Alert. General Appearance- Not in acute distress.   Skin General: Color- Normal Color. Moisture- Normal Moisture.  Neck Carotid Arteries- Normal color. Moisture- Normal Moisture. No carotid bruits. No JVD.  Chest and Lung Exam Auscultation: Breath Sounds:-CTA  Cardiovascular Auscultation:Rythm- RRR Murmurs & Other Heart Sounds:Auscultation of the heart reveals- No Murmurs.  Abdomen Inspection:-Inspeection Normal. Palpation/Percussion:Note:No mass. Palpation and Percussion of the abdomen reveal- Non Tender, Non Distended + BS, no rebound or guarding.    Neurologic Cranial Nerve exam:- CN III-XII intact(No nystagmus), symmetric smile. Strength:- 5/5 equal and symmetric strength both upper and lower extremities.   Anterior thorax- left side reproducible costochondral junction tenderness to palpation.  Heent- maxillary and frontal sinus pressure to palpation.  Lower ext- calfs symmetric, negative homans sign. No pedal edema.    Assessment & Plan:   Acute bronchitis with pharyngitis and cough Negative for strep, flu, and COVID. Persistent cough suggests bronchitis. - Prescribed azithromycin  for 5 days. - Prescribed cough tablet benzonatate  - Advised nasal spray  flonase  if congestion develops.  Costochondritis Intermittent chest pressure likely due to costochondritis. EKG showed sinus rhythm with bradycardia, heart rate increased with activity. - Recommended ibuprofen  200-400 mg every 8 hours for chest pain. - Advised emergency department evaluation if chest pain becomes constant or is associated with jaw pain, shoulder pain, or shortness of breath.  Bradycardia EKG showed sinus rhythm with bradycardia. Heart rate increased with activity. - Continue to monitor heart rate and symptoms.  follow up 10-14 days or sooner if needed     [1]  Allergies Allergen Reactions   Other Cough  [2]  Current Outpatient Medications on File Prior to Visit  Medication Sig Dispense Refill   Calcium-Magnesium-Vitamin D  (CALCIUM 1200+D3 PO) Take by mouth.     fexofenadine (ALLEGRA ODT) 30 MG disintegrating tablet Take 30 mg by mouth  daily.     Magnesium 250 MG TABS      Multiple Vitamin (MULTIVITAMIN) tablet Take 1 tablet by mouth daily.     cetirizine (ZYRTEC) 10 MG tablet Take 10 mg by mouth at bedtime. (Patient not taking: Reported on 08/12/2024)     Fezolinetant  (VEOZAH ) 45 MG TABS Take 1 tablet (45 mg total) by mouth daily at 6 (six) AM. (Patient not taking: Reported on 08/12/2024) 30 tablet 11   No current facility-administered medications on file prior to visit.

## 2024-08-12 NOTE — Patient Instructions (Addendum)
 Acute bronchitis with pharyngitis and cough(some sinus pressure as well) Negative for strep, flu, and COVID. Persistent cough suggests bronchitis. - Prescribed azithromycin  for 5 days. - Prescribed cough tablet benzonatate  - Advised nasal spray flonase  if congestion develops.  Costochondritis Intermittent chest pressure likely due to costochondritis(reproducible pain now and now pressure since Monday). EKG showed sinus rhythm with bradycardia, heart rate increased with activity. - Recommended ibuprofen  200-400 mg every 8 hours for chest pain. - Advised emergency department evaluation if chest pain becomes constant or is associated with jaw pain, shoulder pain, or shortness of breath.  Bradycardia EKG showed sinus rhythm with bradycardia. Heart rate increased with activity. - Continue to monitor heart rate and symptoms.  follow up 10-14 days or sooner if needed
